# Patient Record
Sex: Female | Born: 1990 | Race: Black or African American | Hispanic: No | Marital: Single | Smoking: Former smoker
Health system: Southern US, Community
[De-identification: ages and names within clinical notes are randomized; demographics above are authoritative.]

## PROBLEM LIST (undated history)

## (undated) DIAGNOSIS — F988 Other specified behavioral and emotional disorders with onset usually occurring in childhood and adolescence: Secondary | ICD-10-CM

## (undated) DIAGNOSIS — T7840XA Allergy, unspecified, initial encounter: Secondary | ICD-10-CM

## (undated) DIAGNOSIS — K219 Gastro-esophageal reflux disease without esophagitis: Secondary | ICD-10-CM

## (undated) HISTORY — DX: Gastro-esophageal reflux disease without esophagitis: K21.9

## (undated) HISTORY — DX: Other specified behavioral and emotional disorders with onset usually occurring in childhood and adolescence: F98.8

## (undated) HISTORY — DX: Allergy, unspecified, initial encounter: T78.40XA

---
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
## 2010-09-04 ENCOUNTER — Encounter
Admission: RE | Admit: 2010-09-04 | Discharge: 2010-09-04 | Payer: Self-pay | Source: Home / Self Care | Attending: Orthopedic Surgery | Admitting: Orthopedic Surgery

## 2010-10-16 ENCOUNTER — Ambulatory Visit (INDEPENDENT_AMBULATORY_CARE_PROVIDER_SITE_OTHER): Payer: PRIVATE HEALTH INSURANCE | Admitting: Family Medicine

## 2010-10-16 DIAGNOSIS — R5381 Other malaise: Secondary | ICD-10-CM

## 2010-10-16 DIAGNOSIS — R5383 Other fatigue: Secondary | ICD-10-CM

## 2010-12-21 LAB — WET PREP, GENITAL: Yeast Wet Prep HPF POC: NONE SEEN

## 2010-12-21 LAB — DIFFERENTIAL
Basophils Absolute: 0.1 10*3/uL (ref 0.0–0.1)
Lymphocytes Relative: 15 % (ref 12–46)
Lymphs Abs: 1.1 10*3/uL (ref 0.7–4.0)
Neutro Abs: 5.3 10*3/uL (ref 1.7–7.7)

## 2010-12-21 LAB — BASIC METABOLIC PANEL
BUN: 7 mg/dL (ref 6–23)
Calcium: 9.1 mg/dL (ref 8.4–10.5)
GFR calc non Af Amer: >60 mL/min (ref >60)
Glucose, Bld: 96 mg/dL (ref 70–99)
Sodium: 137 mEq/L (ref 135–145)

## 2010-12-21 LAB — URINE CULTURE

## 2010-12-21 LAB — URINALYSIS, ROUTINE W REFLEX MICROSCOPIC
Bilirubin Urine: NEGATIVE
Nitrite: POSITIVE — AB
Specific Gravity, Urine: 1.01 (ref 1.005–1.030)
Urobilinogen, UA: 1 mg/dL (ref 0.0–1.0)
pH: 6.5 (ref 5.0–8.0)

## 2010-12-21 LAB — URINE MICROSCOPIC-ADD ON

## 2010-12-21 LAB — CBC
Hemoglobin: 12.4 g/dL (ref 12.0–15.0)
Platelets: 190 10*3/uL (ref 150–400)
RDW: 13.6 % (ref 11.5–15.5)
WBC: 7.5 10*3/uL (ref 4.0–10.5)

## 2010-12-21 LAB — POCT PREGNANCY, URINE: Preg Test, Ur: NEGATIVE

## 2011-01-05 ENCOUNTER — Emergency Department (HOSPITAL_COMMUNITY)
Admission: EM | Admit: 2011-01-05 | Discharge: 2011-01-05 | Disposition: A | Discharge status: Home / Self Care | Payer: PRIVATE HEALTH INSURANCE | Attending: Emergency Medicine | Admitting: Emergency Medicine

## 2011-01-05 DIAGNOSIS — J029 Acute pharyngitis, unspecified: Secondary | ICD-10-CM | POA: Insufficient documentation

## 2011-01-05 DIAGNOSIS — F988 Other specified behavioral and emotional disorders with onset usually occurring in childhood and adolescence: Secondary | ICD-10-CM | POA: Insufficient documentation

## 2011-01-22 ENCOUNTER — Emergency Department (HOSPITAL_COMMUNITY)
Admission: EM | Admit: 2011-01-22 | Discharge: 2011-01-23 | Disposition: A | Discharge status: Home / Self Care | Payer: No Typology Code available for payment source | Attending: Emergency Medicine | Admitting: Emergency Medicine

## 2011-01-22 DIAGNOSIS — M25569 Pain in unspecified knee: Secondary | ICD-10-CM | POA: Insufficient documentation

## 2011-01-22 DIAGNOSIS — F988 Other specified behavioral and emotional disorders with onset usually occurring in childhood and adolescence: Secondary | ICD-10-CM | POA: Insufficient documentation

## 2011-01-22 DIAGNOSIS — IMO0002 Reserved for concepts with insufficient information to code with codable children: Secondary | ICD-10-CM | POA: Insufficient documentation

## 2011-01-22 DIAGNOSIS — R51 Headache: Secondary | ICD-10-CM | POA: Insufficient documentation

## 2011-01-22 DIAGNOSIS — Z79899 Other long term (current) drug therapy: Secondary | ICD-10-CM | POA: Insufficient documentation

## 2011-01-22 DIAGNOSIS — M25579 Pain in unspecified ankle and joints of unspecified foot: Secondary | ICD-10-CM | POA: Insufficient documentation

## 2011-01-22 DIAGNOSIS — M25559 Pain in unspecified hip: Secondary | ICD-10-CM | POA: Insufficient documentation

## 2011-01-23 ENCOUNTER — Emergency Department (HOSPITAL_COMMUNITY): Payer: No Typology Code available for payment source

## 2011-04-09 ENCOUNTER — Other Ambulatory Visit: Payer: Self-pay | Admitting: Orthopedic Surgery

## 2011-04-09 DIAGNOSIS — R531 Weakness: Secondary | ICD-10-CM

## 2011-04-09 DIAGNOSIS — M25571 Pain in right ankle and joints of right foot: Secondary | ICD-10-CM

## 2011-04-11 ENCOUNTER — Ambulatory Visit
Admission: RE | Admit: 2011-04-11 | Discharge: 2011-04-11 | Disposition: A | Discharge status: Home / Self Care | Payer: PRIVATE HEALTH INSURANCE | Source: Ambulatory Visit | Attending: Orthopedic Surgery | Admitting: Orthopedic Surgery

## 2011-04-11 DIAGNOSIS — M25571 Pain in right ankle and joints of right foot: Secondary | ICD-10-CM

## 2011-04-11 DIAGNOSIS — R531 Weakness: Secondary | ICD-10-CM

## 2011-11-25 ENCOUNTER — Ambulatory Visit (INDEPENDENT_AMBULATORY_CARE_PROVIDER_SITE_OTHER): Payer: PRIVATE HEALTH INSURANCE | Admitting: Family Medicine

## 2011-11-25 VITALS — BP 100/65 | HR 49 | Temp 97.7°F | Resp 14 | Ht 63.5 in | Wt 124.0 lb

## 2011-11-25 DIAGNOSIS — F988 Other specified behavioral and emotional disorders with onset usually occurring in childhood and adolescence: Secondary | ICD-10-CM | POA: Insufficient documentation

## 2011-11-25 NOTE — Patient Instructions (Signed)
Attention Deficit Hyperactivity Disorder Attention deficit hyperactivity disorder (ADHD) is a problem with behavior issues based on the way the brain functions (neurobehavioral disorder). It is a common reason for behavior and academic problems in school. CAUSES  The cause of ADHD is unknown in most cases. It may run in families. It sometimes can be associated with learning disabilities and other behavioral problems. SYMPTOMS  There are 3 types of ADHD. The 3 types and some of the symptoms include:  Inattentive   Gets bored or distracted easily.   Loses or forgets things. Forgets to hand in homework.   Has trouble organizing or completing tasks.   Difficulty staying on task.   An inability to organize daily tasks and school work.   Leaving projects, chores, or homework unfinished.   Trouble paying attention or responding to details. Careless mistakes.   Difficulty following directions. Often seems like is not listening.   Dislikes activities that require sustained attention (like chores or homework).   Hyperactive-impulsive   Feels like it is impossible to sit still or stay in a seat. Fidgeting with hands and feet.   Trouble waiting turn.   Talking too much or out of turn. Interruptive.   Speaks or acts impulsively.   Aggressive, disruptive behavior.   Constantly busy or on the go, noisy.   Combined   Has symptoms of both of the above.  Often children with ADHD feel discouraged about themselves and with school. They often perform well below their abilities in school. These symptoms can cause problems in home, school, and in relationships with peers. As children get older, the excess motor activities can calm down, but the problems with paying attention and staying organized persist. Most children do not outgrow ADHD but with good treatment can learn to cope with the symptoms. DIAGNOSIS  When ADHD is suspected, the diagnosis should be made by professionals trained in  ADHD.  Diagnosis will include:  Ruling out other reasons for the child's behavior.   The caregivers will check with the child's school and check their medical records.   They will talk to teachers and parents.   Behavior rating scales for the child will be filled out by those dealing with the child on a daily basis.  A diagnosis is made only after all information has been considered. TREATMENT  Treatment usually includes behavioral treatment often along with medicines. It may include stimulant medicines. The stimulant medicines decrease impulsivity and hyperactivity and increase attention. Other medicines used include antidepressants and certain blood pressure medicines. Most experts agree that treatment for ADHD should address all aspects of the child's functioning. Treatment should not be limited to the use of medicines alone. Treatment should include structured classroom management. The parents must receive education to address rewarding good behavior, discipline, and limit-setting. Tutoring or behavioral therapy or both should be available for the child. If untreated, the disorder can have long-term serious effects into adolescence and adulthood. HOME CARE INSTRUCTIONS   Often with ADHD there is a lot of frustration among the family in dealing with the illness. There is often blame and anger that is not warranted. This is a life long illness. There is no way to prevent ADHD. In many cases, because the problem affects the family as a whole, the entire family may need help. A therapist can help the family find better ways to handle the disruptive behaviors and promote change. If the child is young, most of the therapist's work is with the parents. Parents will   learn techniques for coping with and improving their child's behavior. Sometimes only the child with the ADHD needs counseling. Your caregivers can help you make these decisions.   Children with ADHD may need help in organizing. Some  helpful tips include:   Keep routines the same every day from wake-up time to bedtime. Schedule everything. This includes homework and playtime. This should include outdoor and indoor recreation. Keep the schedule on the refrigerator or a bulletin board where it is frequently seen. Mark schedule changes as far in advance as possible.   Have a place for everything and keep everything in its place. This includes clothing, backpacks, and school supplies.   Encourage writing down assignments and bringing home needed books.   Offer your child a well-balanced diet. Breakfast is especially important for school performance. Children should avoid drinks with caffeine including:   Soft drinks.   Coffee.   Tea.   However, some older children (adolescents) may find these drinks helpful in improving their attention.   Children with ADHD need consistent rules that they can understand and follow. If rules are followed, give small rewards. Children with ADHD often receive, and expect, criticism. Look for good behavior and praise it. Set realistic goals. Give clear instructions. Look for activities that can foster success and self-esteem. Make time for pleasant activities with your child. Give lots of affection.   Parents are their children's greatest advocates. Learn as much as possible about ADHD. This helps you become a stronger and better advocate for your child. It also helps you educate your child's teachers and instructors if they feel inadequate in these areas. Parent support groups are often helpful. A national group with local chapters is called CHADD (Children and Adults with Attention Deficit Hyperactivity Disorder).  PROGNOSIS  There is no cure for ADHD. Children with the disorder seldom outgrow it. Many find adaptive ways to accommodate the ADHD as they mature. SEEK MEDICAL CARE IF:  Your child has repeated muscle twitches, cough or speech outbursts.   Your child has sleep problems.   Your  child has a marked loss of appetite.   Your child develops depression.   Your child has new or worsening behavioral problems.   Your child develops dizziness.   Your child has a racing heart.   Your child has stomach pains.   Your child develops headaches.  Document Released: 08/22/2002 Document Revised: 08/21/2011 Document Reviewed: 04/03/2008 ExitCare Patient Information 2012 [employer]. 

## 2011-11-25 NOTE — Progress Notes (Signed)
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 She was seen by a local therapist named Jae Dire muscle white where it was determined that she does have ADD. She's been seeing Dr. Merla Riches who has been treating her with Adderall 15 mg twice a day since. She is no family history of sudden death, no palpitations, no history of congenital heart disease, no history of hypertension, no chest pain, no shortness of breath, and has been doing well in school on the current medication.  Patient seen with grandmother today. Both are pleasant and appropriate.  Objective: HEENT unremarkable  Chest: Clear  Heart: Regular, bradycardic without murmur.  Assessment: Stable on a Adderall.  Plan continue meds x3 months and recheck

## 2012-02-26 ENCOUNTER — Telehealth: Payer: Self-pay

## 2012-02-26 NOTE — Telephone Encounter (Signed)
NEEDS REFILL ON ADDERALL - DOOLITTLE USUALLY WRITES THREE PRESCRIPTIONS AT A TIME FOR IT

## 2012-02-26 NOTE — Telephone Encounter (Signed)
Chart pulled to PA 

## 2012-02-29 MED ORDER — AMPHETAMINE-DEXTROAMPHETAMINE 15 MG PO TABS: 15.0000 mg | ORAL_TABLET | Freq: Two times a day (BID) | ORAL | Status: DC

## 2012-02-29 NOTE — Telephone Encounter (Signed)
Rx done x 3 months but then she needs recheck.

## 2012-02-29 NOTE — Telephone Encounter (Signed)
Grandmother notified

## 2012-04-16 ENCOUNTER — Ambulatory Visit (INDEPENDENT_AMBULATORY_CARE_PROVIDER_SITE_OTHER): Payer: PRIVATE HEALTH INSURANCE | Admitting: Physician Assistant

## 2012-04-16 VITALS — BP 88/60 | HR 62 | Temp 97.7°F | Resp 16 | Ht 63.75 in | Wt 128.8 lb

## 2012-04-16 DIAGNOSIS — L709 Acne, unspecified: Secondary | ICD-10-CM

## 2012-04-16 DIAGNOSIS — R41840 Attention and concentration deficit: Secondary | ICD-10-CM

## 2012-04-16 DIAGNOSIS — L708 Other acne: Secondary | ICD-10-CM

## 2012-04-16 DIAGNOSIS — M25569 Pain in unspecified knee: Secondary | ICD-10-CM

## 2012-04-16 MED ORDER — MELOXICAM 15 MG PO TABS: 15.0000 mg | ORAL_TABLET | Freq: Every day | ORAL | Status: AC

## 2012-04-16 MED ORDER — TRETINOIN 0.05 % EX CREA: TOPICAL_CREAM | Freq: Every day | CUTANEOUS | Status: AC

## 2012-04-16 NOTE — Progress Notes (Signed)
  Subjective:    Patient ID: Maureen Cantrell, female    DOB: 1991/03/05, 21 y.o.   MRN: [id number]  HPI Patient presents for recheck of ADHD.  She currently on Adderall 15 mg bid and is stable on her current dose. Has no problems with the medication and is satisfied with this treatment.  She also is concerned about acne on her back that has been there for "years." she has not tried anything OTC or any prescription medications for this. She does not have any facial acne.  In addition she complains of left knee pain x 6 months. Says this is a dull, achy pain that occurs daily and is worse when she is active. She plays basketball at [employer] and therefore is active year round without any time that she rests her knee.  Her trainer has given her ibuprofen which helps.  No specific injury - gradual onset of pain.      Review of Systems  All other systems reviewed and are negative.       Objective:   Physical Exam  Constitutional: She is oriented to person, place, and time. She appears well-developed and well-nourished.  HENT:  Head: Normocephalic and atraumatic.  Right Ear: External ear normal.  Left Ear: External ear normal.  Eyes: Conjunctivae are normal.  Neck: Normal range of motion.  Cardiovascular: Normal rate, regular rhythm and normal heart sounds.   Pulmonary/Chest: Effort normal and breath sounds normal.  Abdominal: Soft. Bowel sounds are normal.  Musculoskeletal:       Left knee: She exhibits normal range of motion, no swelling, no effusion, no ecchymosis, no deformity, no laceration, no erythema, normal alignment, no LCL laxity, normal patellar mobility, no bony tenderness, normal meniscus and no MCL laxity. tenderness found. Patellar tendon tenderness noted. No medial joint line, no lateral joint line, no MCL and no LCL tenderness noted.  Neurological: She is alert and oriented to person, place, and time.  Skin:     Psychiatric: She has a normal mood and affect. Her behavior is  normal. Judgment and thought content normal.          Assessment & Plan:   1. Knee pain  Try Mobic 15 mg daily. Likely tendinitis - treat with anti-inflammatories, ice, and rest (as much as possible)  2. Attention or concentration deficit  Call for refill in September. Last refill 02/29/12 x 3 months. Ok for 3 months at a time with follow up in 6 months.   3. Acne  Recommend OTC Benzoyl peroxide wash with daily application of Retin-A

## 2012-04-16 NOTE — Patient Instructions (Addendum)
Acne Free benzoyl peroxide wash (over the counter) Can use Cetaphil moisturizer if skin is dry.

## 2012-05-25 ENCOUNTER — Telehealth: Payer: Self-pay

## 2012-05-25 MED ORDER — AMPHETAMINE-DEXTROAMPHETAMINE 15 MG PO TABS: 15.0000 mg | ORAL_TABLET | Freq: Every day | ORAL | Status: DC

## 2012-05-25 MED ORDER — AMPHETAMINE-DEXTROAMPHETAMINE 15 MG PO TABS: 15.0000 mg | ORAL_TABLET | Freq: Two times a day (BID) | ORAL | Status: DC

## 2012-05-25 NOTE — Telephone Encounter (Signed)
I will write for 3 months at a time, but if any are lost they will not be replaced, no exceptions. Rx's written.

## 2012-05-25 NOTE — Telephone Encounter (Signed)
To PAs to rx-- ok x 3 months at a time per last OV

## 2012-05-25 NOTE — Telephone Encounter (Signed)
Mother is requesting three scripts for adderall for daughter.  Call [phone]

## 2012-05-26 NOTE — Telephone Encounter (Signed)
Notified that Rxs are ready for p/up and that if lost they can not be replaced.

## 2012-08-17 ENCOUNTER — Telehealth: Payer: Self-pay | Admitting: Physician Assistant

## 2012-08-17 MED ORDER — AMPHETAMINE-DEXTROAMPHETAMINE 15 MG PO TABS: 15.0000 mg | ORAL_TABLET | Freq: Two times a day (BID) | ORAL | Status: DC

## 2012-08-17 NOTE — Telephone Encounter (Signed)
Patient has brought in rx written on 05/25/12 that was to be filled on/after 11/16 - was written as 1 tablet daily but given #60. This is incorrect, she takes bid. Will write new rx today.

## 2012-09-15 ENCOUNTER — Telehealth: Payer: Self-pay

## 2012-09-15 NOTE — Telephone Encounter (Signed)
Patient due for follow up in Feb/ please advise on refill.

## 2012-09-15 NOTE — Telephone Encounter (Signed)
Adderall 3 months supply [phone]

## 2012-09-17 MED ORDER — AMPHETAMINE-DEXTROAMPHETAMINE 15 MG PO TABS: 15.0000 mg | ORAL_TABLET | Freq: Two times a day (BID) | ORAL | Status: DC

## 2012-09-17 NOTE — Telephone Encounter (Signed)
Called patient to advise her grandmother will p/u

## 2012-09-17 NOTE — Telephone Encounter (Signed)
1 month refill given - needs follow up before out

## 2012-10-04 ENCOUNTER — Ambulatory Visit (INDEPENDENT_AMBULATORY_CARE_PROVIDER_SITE_OTHER): Payer: PRIVATE HEALTH INSURANCE | Admitting: Family Medicine

## 2012-10-04 VITALS — BP 95/57 | HR 71 | Temp 98.9°F | Resp 17 | Ht 64.5 in | Wt 125.0 lb

## 2012-10-04 DIAGNOSIS — F988 Other specified behavioral and emotional disorders with onset usually occurring in childhood and adolescence: Secondary | ICD-10-CM

## 2012-10-04 MED ORDER — AMPHETAMINE-DEXTROAMPHETAMINE 15 MG PO TABS: 15.0000 mg | ORAL_TABLET | Freq: Two times a day (BID) | ORAL | Status: DC

## 2012-10-04 NOTE — Progress Notes (Signed)
[facility] [address], [place] [address] [phone]  Date:  10/04/2012   Name:  Maureen Cantrell   DOB:  02-03-1991   MRN:  [id number]  PCP:  Tonye Pearson, MD    Chief Complaint: Medication Refill   History of Present Illness:  Maureen Cantrell is a 22 y.o. very pleasant female patient who presents with the following:  History of ADD treated with adderall. Last seen here in August.  She was given her most recent RF of adderall #60 tablets on 09/17/12 and asked to RTC for a follow- up prior to expiration of her rx.    She is doing well at [employer].  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   She is doing well with her current dose of medication.  She notes no problems with heart palpitation, trouble sleeping or anything else.    Her LMP is today. 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 ADD (attention deficit disorder)    History reviewed. No pertinent past medical history.  History reviewed. No pertinent past surgical history.  History  Substance Use Topics  . Smoking status: Never Smoker   . Smokeless tobacco: Not on file  . Alcohol Use: No    History reviewed. No pertinent family history.  No Known Allergies  Medication list has been reviewed and updated.  Current Outpatient Prescriptions on File Prior to Visit  Medication Sig Dispense Refill  . amphetamine-dextroamphetamine (ADDERALL) 15 MG tablet Take 1 tablet (15 mg total) by mouth daily. May fill on/after 07/31/2012.  60 tablet  0  . amphetamine-dextroamphetamine (ADDERALL) 15 MG tablet Take 1 tablet (15 mg total) by mouth 2 (two) times daily.  60 tablet  0  . amphetamine-dextroamphetamine (ADDERALL) 15 MG tablet Take 1 tablet (15 mg total) by mouth 2 (two) times daily. Due for follow up in Feb. 2014  60 tablet  0  . meloxicam  (MOBIC) 15 MG tablet Take 1 tablet (15 mg total) by mouth daily.  30 tablet  1  . tretinoin (RETIN-A) 0.05 % cream Apply topically at bedtime.  45 g  0    Review of Systems:  As per HPI- otherwise negative.   Physical Examination: Filed Vitals:   10/04/12 0804  BP: 95/57  Pulse: 71  Temp: 98.9 F (37.2 C)  Resp: 17   Filed Vitals:   10/04/12 0804  Height: 5' 4.5" (1.638 m)  Weight: 125 lb (56.7 kg)   Body mass index is 21.12 kg/(m^2). Ideal Body Weight: Weight in (lb) to have BMI = 25: 147.6   GEN: WDWN, NAD, Non-toxic, A & O x 3, healthy and well- appearing HEENT: Atraumatic, Normocephalic. Neck supple. No masses, No LAD. Ears and Nose: No external deformity. CV: RRR, No M/G/R. No JVD. No thrill. No extra heart sounds. PULM: CTA B, no wheezes, crackles, rhonchi. No retractions. No resp. distress. No accessory muscle use. ABD: S, NT, ND EXTR: No c/c/e NEURO Normal gait.  PSYCH: Normally interactive. Conversant. Not depressed or anxious appearing.  Calm demeanor.    Assessment and Plan: 1. ADD (attention deficit disorder)  amphetamine-dextroamphetamine (ADDERALL) 15 MG tablet, amphetamine-dextroamphetamine (ADDERALL) 15 MG tablet, DISCONTINUED: amphetamine-dextroamphetamine (ADDERALL) 15 MG tablet   Gave 3 months of adderall today to fill Feb, March and April. In April  she may have 3 months more of medication, then recheck in 6 months time,   Abbe Amsterdam, MD

## 2012-10-04 NOTE — Patient Instructions (Addendum)
Good luck with the rest of your year! Please give [place] a call towards the end of April and we can give you 3 more months of Adderall- then will need a clinic recheck in 6 months.

## 2013-01-27 ENCOUNTER — Ambulatory Visit (INDEPENDENT_AMBULATORY_CARE_PROVIDER_SITE_OTHER): Payer: PRIVATE HEALTH INSURANCE | Admitting: Family Medicine

## 2013-01-27 VITALS — BP 118/74 | HR 66 | Temp 98.0°F | Resp 17 | Ht 64.0 in | Wt 124.0 lb

## 2013-01-27 DIAGNOSIS — F988 Other specified behavioral and emotional disorders with onset usually occurring in childhood and adolescence: Secondary | ICD-10-CM

## 2013-01-27 MED ORDER — AMPHETAMINE-DEXTROAMPHETAMINE 15 MG PO TABS: 15.0000 mg | ORAL_TABLET | Freq: Two times a day (BID) | ORAL | Status: DC

## 2013-01-27 NOTE — Progress Notes (Signed)
[facility] [address] [place] [address] [phone]  Date:  01/27/2013   Name:  Maureen Cantrell   DOB:  09/20/1990   MRN:  [id number]  PCP:  Tonye Pearson, MD    Chief Complaint: Medication Refill   History of Present Illness:  Maureen Cantrell is a 22 y.o. very pleasant female patient who presents with the following:  She just graduated from [employer].  She would like to enter the [employer] in the future, and would like to try and taper off of her her adderall in the next few months. She cannot be on adderall and join the [employer].    She is working with childrne with special needs right now.   LMP 01/12/13  She is doing well with her current adderall dose and wants to continue for now.   Patient Active Problem List   Diagnosis Date Noted  . ADD (attention deficit disorder) 11/25/2011    Past Medical History  Diagnosis Date  . Allergy     History reviewed. No pertinent past surgical history.  History  Substance Use Topics  . Smoking status: Never Smoker   . Smokeless tobacco: Not on file  . Alcohol Use: No    History reviewed. No pertinent family history.  No Known Allergies  Medication list has been reviewed and updated.  Current Outpatient Prescriptions on File Prior to Visit  Medication Sig Dispense Refill  . amphetamine-dextroamphetamine (ADDERALL) 15 MG tablet Take 1 tablet (15 mg total) by mouth daily. May fill on/after 07/31/2012.  60 tablet  0  . amphetamine-dextroamphetamine (ADDERALL) 15 MG tablet Take 1 tablet (15 mg total) by mouth 2 (two) times daily.  60 tablet  0  . amphetamine-dextroamphetamine (ADDERALL) 15 MG tablet Take 1 tablet (15 mg total) by mouth 2 (two) times daily. To fill 11/13/12  60 tablet  0  . amphetamine-dextroamphetamine (ADDERALL) 15 MG tablet Take 1 tablet (15 mg total) by mouth 2 (two) times daily. To fill 11/13/12  60 tablet  0  . amphetamine-dextroamphetamine (ADDERALL) 15 MG tablet Take 1  tablet (15 mg total) by mouth 2 (two) times daily. To fill 12/14/12  60 tablet  0  . meloxicam (MOBIC) 15 MG tablet Take 1 tablet (15 mg total) by mouth daily.  30 tablet  1  . tretinoin (RETIN-A) 0.05 % cream Apply topically at bedtime.  45 g  0   No current facility-administered medications on file prior to visit.    Review of Systems:  As per HPI- otherwise negative.   Physical Examination: Filed Vitals:   01/27/13 1157  BP: 118/74  Pulse: 66  Temp: 98 F (36.7 C)  Resp: 17   Filed Vitals:   01/27/13 1157  Height: 5\' 4"  (1.626 m)  Weight: 124 lb (56.246 kg)   Body mass index is 21.27 kg/(m^2). Ideal Body Weight: Weight in (lb) to have BMI = 25: 145.3  GEN: WDWN, NAD, Non-toxic, A & O x 3 HEENT: Atraumatic, Normocephalic. Neck supple. No masses, No LAD. Ears and Nose: No external deformity. CV: RRR, No M/G/R. No JVD. No thrill. No extra heart sounds. PULM: CTA B, no wheezes, crackles, rhonchi. No retractions. No resp. distress. No accessory muscle use. EXTR: No c/c/e NEURO Normal gait.  PSYCH: Normally interactive. Conversant. Not depressed or anxious appearing.  Calm demeanor.    Assessment and Plan: ADD (attention deficit disorder) - Plan: amphetamine-dextroamphetamine (ADDERALL) 15 MG tablet, amphetamine-dextroamphetamine (ADDERALL) 15 MG  tablet, amphetamine-dextroamphetamine (ADDERALL) 15 MG tablet  Refilled adderall for another 3 months.  Follow-up as needed.  Went over how to taper her adderall over the course of 2 or 3 weeks when she is ready   Signed Abbe Amsterdam, MD

## 2013-01-27 NOTE — Patient Instructions (Addendum)
Congratulation on your graduation.  Let us know if you need any help tapering off of the adderall

## 2013-04-15 DIAGNOSIS — Z0271 Encounter for disability determination: Secondary | ICD-10-CM

## 2013-06-11 ENCOUNTER — Ambulatory Visit: Payer: PRIVATE HEALTH INSURANCE | Admitting: Internal Medicine

## 2013-06-11 VITALS — BP 92/60 | HR 66 | Temp 98.8°F | Resp 18 | Ht 63.5 in | Wt 122.6 lb

## 2013-06-11 DIAGNOSIS — Z029 Encounter for administrative examinations, unspecified: Secondary | ICD-10-CM

## 2013-06-11 DIAGNOSIS — N644 Mastodynia: Secondary | ICD-10-CM

## 2013-06-11 DIAGNOSIS — Z0289 Encounter for other administrative examinations: Secondary | ICD-10-CM

## 2013-06-13 NOTE — Progress Notes (Signed)
  Subjective:    Patient ID: Maureen Cantrell, female    DOB: [birth year], 22 y.o.   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  Never expos to TB w/ prior neg test History of ADD now doing well off medication  Review of Systems No weight loss or night sweats No fever No cough or shortness of breath    Objective:   Physical Exam BP 92/60  Pulse 66  Temp(Src) 98.8 F (37.1 C) (Oral)  Resp 18  Ht 5' 3.5" (1.613 m)  Wt 122 lb 9.6 oz (55.611 kg)  BMI 21.37 kg/m2  SpO2 100%  LMP 05/02/2013 nad HEENT-clear Lungs -cl Ht-reg no M R breast larger than L No nipple d/c  Tender upper inner quadr with firmness that is regular/no dimpling No axillary nodes extr clear Mood good/aff appr      Assessment & Plan:  Unilateral breast tenderness with swelling-suspect premenstrual fibro- adenomatous changes  Will reexamine post menses  If they become concerned we can schedule an ultrasound/grandmother will call  Okay for Student teaching  Discussed careers that suit attention deficit disorder/no need for medications

## 2013-11-02 ENCOUNTER — Ambulatory Visit (INDEPENDENT_AMBULATORY_CARE_PROVIDER_SITE_OTHER): Payer: PRIVATE HEALTH INSURANCE | Admitting: Family Medicine

## 2013-11-02 VITALS — BP 110/60 | HR 62 | Temp 98.0°F | Resp 16 | Ht 62.0 in | Wt 120.0 lb

## 2013-11-02 DIAGNOSIS — F988 Other specified behavioral and emotional disorders with onset usually occurring in childhood and adolescence: Secondary | ICD-10-CM

## 2013-11-02 MED ORDER — AMPHETAMINE-DEXTROAMPHETAMINE 15 MG PO TABS: 15.0000 mg | ORAL_TABLET | Freq: Two times a day (BID) | ORAL | Status: DC

## 2013-11-02 NOTE — Progress Notes (Signed)
[facility] [address], [place] [id number] [phone]  Date:  11/02/2013   Name:  Maureen Cantrell   DOB:  03/05/1991   MRN:  [id number]  PCP:  Tonye PearsonOLITTLE, ROBERT P, MD    Chief Complaint: Medication Refill   History of Present Illness:  Maureen Cantrell is a 23 y.o. very pleasant female patient who presents with the following:  History of ADD- otherwise generally healthy She would like to restart her adderall- she has not taken it in a couple of months.  She wanted to see how she did without the medication, but has had trouble with focusing. 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   She did notice decreased appeitite while on the medication, but slept well She is concerned that she might need to gain weight, but has a "fast metabolism" and has trouble with this.    Wt Readings from Last 3 Encounters:  11/02/13 120 lb (54.432 kg)  06/11/13 122 lb 9.6 oz (55.611 kg)  01/27/13 124 lb (56.246 kg)   She had been taking adderall 15 BID.  Felt that this dose was good for her in the past LMP was last week.    Patient Active Problem List   Diagnosis Date Noted  . ADD (attention deficit disorder) 11/25/2011    Past Medical History  Diagnosis Date  . Allergy     History reviewed. No pertinent past surgical history.  History  Substance Use Topics  . Smoking status: Never Smoker   . Smokeless tobacco: Not on file  . Alcohol Use: No    History reviewed. No pertinent family history.  No Known Allergies  Medication list has been reviewed and updated.  Current Outpatient Prescriptions on File Prior to Visit  Medication Sig Dispense Refill  . amphetamine-dextroamphetamine (ADDERALL) 15 MG tablet Take 1 tablet (15 mg total) by mouth 2 (two) times daily. To fill 12/14/12  60 tablet  0  . amphetamine-dextroamphetamine (ADDERALL) 15 MG tablet Take 1 tablet (15 mg total) by mouth 2 (two) times daily.  60 tablet  0  .  amphetamine-dextroamphetamine (ADDERALL) 15 MG tablet Take 1 tablet (15 mg total) by mouth 2 (two) times daily. 02/25/2013  60 tablet  0  . amphetamine-dextroamphetamine (ADDERALL) 15 MG tablet Take 1 tablet (15 mg total) by mouth 2 (two) times daily. To fill 03/26/2013  60 tablet  0   No current facility-administered medications on file prior to visit.    Review of Systems:  As per HPI- otherwise negative.   Physical Examination: Filed Vitals:   11/02/13 1154  BP: 110/60  Pulse: 62  Temp: 98 F (36.7 C)  Resp: 16   Filed Vitals:   11/02/13 1154  Height: 5\' 2"  (1.575 m)  Weight: 120 lb (54.432 kg)   Body mass index is 21.94 kg/(m^2). Ideal Body Weight: Weight in (lb) to have BMI = 25: 136.4  GEN: WDWN, NAD, Non-toxic, A & O x 3 HEENT: Atraumatic, Normocephalic. Neck supple. No masses, No LAD. Ears and Nose: No external deformity. CV: RRR, No M/G/R. No JVD. No thrill. No extra heart sounds. PULM: CTA B, no wheezes, crackles, rhonchi. No retractions. No resp. distress. No accessory muscle use. ABD: S, NT, ND, +BS. No rebound. No HSM. EXTR: No c/c/e NEURO Normal gait.  PSYCH: Normally interactive. Conversant. Not depressed or anxious appearing.  Calm demeanor.    Assessment and Plan: ADD (attention deficit disorder) - Plan: amphetamine-dextroamphetamine (  ADDERALL) 15 MG tablet, amphetamine-dextroamphetamine (ADDERALL) 15 MG tablet, amphetamine-dextroamphetamine (ADDERALL) 15 MG tablet  Restart adderall gradually- See patient instructions for more details.   Reassured that her BMI is actually ideal and she is at a good weight for her health.  Encouraged nutritious snacks and regular meals.    May refill adderall for 3 months in May, recheck here in 6 months   Signed Abbe Amsterdam, MD

## 2013-11-02 NOTE — Patient Instructions (Signed)
Start back on your adderall- take a 1/2 tablet once or twice a day, then build back up to a whole tablet twice a day.  Let me know if you have any problems.  If you are doing well I can give you 3 months more in May.

## 2014-01-27 ENCOUNTER — Telehealth: Payer: Self-pay

## 2014-01-27 DIAGNOSIS — F988 Other specified behavioral and emotional disorders with onset usually occurring in childhood and adolescence: Secondary | ICD-10-CM

## 2014-01-27 MED ORDER — AMPHETAMINE-DEXTROAMPHETAMINE 15 MG PO TABS: 15.0000 mg | ORAL_TABLET | Freq: Two times a day (BID) | ORAL | Status: DC

## 2014-01-27 NOTE — Telephone Encounter (Signed)
Did refills for may, June and July today. Called and left message with female who answered her phone

## 2014-01-27 NOTE — Telephone Encounter (Signed)
Pt request adderall refill.  Pt [phone]

## 2014-03-01 ENCOUNTER — Telehealth: Payer: Self-pay

## 2014-03-01 NOTE — Telephone Encounter (Signed)
The only med I see is Adderall. We gave her 3 months on 01/27/14. Left message on machine to call back

## 2014-03-01 NOTE — Telephone Encounter (Signed)
Pt called and said her prescriptions were sent to the wrong pharmacy, and would like them resent to [facility]

## 2014-03-02 NOTE — Telephone Encounter (Signed)
Pt states she has located the prescriptions and has gotten them filled. No refills needed.

## 2014-05-04 ENCOUNTER — Ambulatory Visit (INDEPENDENT_AMBULATORY_CARE_PROVIDER_SITE_OTHER): Payer: No Typology Code available for payment source | Admitting: Physician Assistant

## 2014-05-04 VITALS — BP 90/60 | HR 67 | Temp 97.8°F | Resp 16 | Ht 63.0 in | Wt 122.0 lb

## 2014-05-04 DIAGNOSIS — Z23 Encounter for immunization: Secondary | ICD-10-CM

## 2014-05-04 DIAGNOSIS — F988 Other specified behavioral and emotional disorders with onset usually occurring in childhood and adolescence: Secondary | ICD-10-CM

## 2014-05-04 MED ORDER — AMPHETAMINE-DEXTROAMPHETAMINE 15 MG PO TABS: 15.0000 mg | ORAL_TABLET | Freq: Two times a day (BID) | ORAL | Status: DC

## 2014-05-04 NOTE — Patient Instructions (Addendum)
When you come next time, have nothing to eat or drink (water, black coffee or unsweetened tea are OK) for 8-12 hours before.  That way we can get accurate results for diabetes and cholesterol screening.  [facility] Policy for Prescribing Controlled Substances (Revised 07/2012) 1. Prescriptions for controlled substances will be filled by ONE provider at [facility] with whom you have established and developed a plan for your care, including follow-up. 2. You are encouraged to schedule an appointment with your prescriber at our appointment center for follow-up visits whenever possible. 3. If you request a prescription for the controlled substance while at [facility] for an acute problem (with someone other than your regular prescriber), you MAY be given a ONE-TIME prescription for a 30-day supply of the controlled substance, to allow time for you to return to see your regular prescriber for additional prescriptions.

## 2014-05-04 NOTE — Progress Notes (Signed)
   Subjective:    Patient ID: Maureen Cantrell, female    DOB: 01/09/1991, 23 y.o.   MRN: [id number]   PCP: Tonye PearsonOLITTLE, ROBERT P, MD  Chief Complaint  Patient presents with  . Medication Refill    Adderall  . Glucose Check    States her Grandma wanted her to get it checked    Medications, allergies, past medical history, surgical history, family history, social history and problem list reviewed and updated.  HPI  Presents for medication refills.  She's doing well on the current dose of Adderall.  She's sleeping well, has a stable appetite and no adverse effects.  Her grandmother would like her to receive a flu vaccine and get screened for pre-diabetes and hyperlipidemia while she is here.  She is not fasting today (ate about 2.5 hours ago), and doesn't want her blood drawn.   Review of Systems     Objective:   Physical Exam  Constitutional: She is oriented to person, place, and time. She appears well-developed and well-nourished. She is active and cooperative. No distress.  BP 90/60  Pulse 67  Temp(Src) 97.8 F (36.6 C) (Oral)  Resp 16  Ht 5\' 3"  (1.6 m)  Wt 122 lb (55.339 kg)  BMI 21.62 kg/m2  SpO2 100%  LMP 04/04/2014   Cardiovascular: Normal rate.   Pulmonary/Chest: Effort normal.  Neurological: She is alert and oriented to person, place, and time.  Skin: Skin is warm and dry.  Psychiatric: She has a normal mood and affect. Her speech is normal and behavior is normal.          Assessment & Plan:  1. ADD (attention deficit disorder) Reviewed [facility] Controlled Substance Policy.  She may call in 3 months for another 3 prescriptions, and then needs to follow-up with Dr. Merla Richesoolittle. - amphetamine-dextroamphetamine (ADDERALL) 15 MG tablet; Take 1 tablet (15 mg total) by mouth 2 (two) times daily. May fill 30 days after date on prescription  Dispense: 60 tablet; Refill: 0 - amphetamine-dextroamphetamine (ADDERALL) 15 MG tablet; Take 1 tablet (15 mg total) by mouth 2  (two) times daily. May fill 60 days after date on prescription  Dispense: 60 tablet; Refill: 0 - amphetamine-dextroamphetamine (ADDERALL) 15 MG tablet; Take 1 tablet (15 mg total) by mouth 2 (two) times daily.  Dispense: 60 tablet; Refill: 0  2. Need for influenza vaccination - Flu Vaccine QUAD 36+ mos IM  Will plan on fasting glucose and lipids at her next visit.  Fernande Brashelle S. Jeevan Kalla, PA-C Physician Assistant-Certified [facility]

## 2014-07-30 ENCOUNTER — Telehealth: Payer: Self-pay

## 2014-07-30 DIAGNOSIS — F988 Other specified behavioral and emotional disorders with onset usually occurring in childhood and adolescence: Secondary | ICD-10-CM

## 2014-07-30 NOTE — Telephone Encounter (Signed)
Patient is calling to request 3 adderall refills. Please call patient when ready for pickup.

## 2014-07-31 MED ORDER — AMPHETAMINE-DEXTROAMPHETAMINE 15 MG PO TABS: 15.0000 mg | ORAL_TABLET | Freq: Two times a day (BID) | ORAL | Status: DC

## 2014-07-31 NOTE — Telephone Encounter (Signed)
Rxs printed. Please remind her that when she's due for the next 3, she needs a visit with Dr. Merla Richesoolittle, and should come in fasting for labs.

## 2014-08-01 NOTE — Telephone Encounter (Signed)
LM for pt that Rxs are ready and f/up info.

## 2014-10-17 ENCOUNTER — Telehealth: Payer: Self-pay

## 2014-10-17 NOTE — Telephone Encounter (Signed)
PA started for Adderall, awaiting clinical questions.

## 2014-10-20 NOTE — Telephone Encounter (Signed)
Patient calling to follow up on her PA for Adderall.  [phone] (H)

## 2014-10-20 NOTE — Telephone Encounter (Signed)
Approved 09/29/2014 thru 10/20/2015. Pt notified and faxed to pharmacy.

## 2015-06-14 ENCOUNTER — Ambulatory Visit (INDEPENDENT_AMBULATORY_CARE_PROVIDER_SITE_OTHER): Payer: BC Managed Care – PPO

## 2015-06-14 ENCOUNTER — Ambulatory Visit (INDEPENDENT_AMBULATORY_CARE_PROVIDER_SITE_OTHER): Payer: BC Managed Care – PPO | Admitting: Urgent Care

## 2015-06-14 VITALS — BP 98/76 | HR 76 | Temp 98.3°F | Resp 16 | Ht 64.5 in | Wt 123.4 lb

## 2015-06-14 DIAGNOSIS — K219 Gastro-esophageal reflux disease without esophagitis: Secondary | ICD-10-CM

## 2015-06-14 DIAGNOSIS — R0789 Other chest pain: Secondary | ICD-10-CM | POA: Insufficient documentation

## 2015-06-14 MED ORDER — ESOMEPRAZOLE MAGNESIUM 40 MG PO CPDR: 40.0000 mg | DELAYED_RELEASE_CAPSULE | Freq: Every day | ORAL | Status: DC

## 2015-06-14 MED ORDER — FAMOTIDINE 20 MG PO TABS: 20.0000 mg | ORAL_TABLET | Freq: Two times a day (BID) | ORAL | Status: DC

## 2015-06-14 NOTE — Patient Instructions (Signed)

## 2015-06-14 NOTE — Progress Notes (Signed)
    MRN: [id number] DOB: 09-12-1991  Subjective:   Maureen Cantrell is a 24 y.o. female presenting for chief complaint of Palpitations; heart hurts after eating; and hurts sometimes to take a deep breath  Reports 1 month history of mid-right sided chest pain. Pain is sharp in nature, intermittent, lasts about 5-10 minutes followed by achy pain lesser in severity and is sometimes associated with food such as spicy food. Also admits slight difficulty taking a deep breath with onset of her chest pain and has palpitations and heart racing intermittently not associated with her chest pain. Patient eats unhealthy foods, fried foods. Has not tried any medications for relief. Denies fever, shortness of breath, wheezing, chest tightness, cough, sore throat, nausea, vomiting, abdominal pain. Denies family history of cancer, heart disease, irregular heart rhythms in her immediate family. Denies smoking. Denies any other aggravating or relieving factors, no other questions or concerns.  Kameela has a current medication list which includes the following prescription(s): amphetamine-dextroamphetamine, amphetamine-dextroamphetamine, and amphetamine-dextroamphetamine. Also has No Known Allergies.  Luma  has a past medical history of Allergy and ADD (attention deficit disorder). Also  has no past surgical history on file.  Objective:   Vitals: BP 98/76 mmHg  Pulse 76  Temp(Src) 98.3 F (36.8 C) (Oral)  Resp 16  Ht 5' 4.5" (1.638 m)  Wt 123 lb 6.4 oz (55.974 kg)  BMI 20.86 kg/m2  SpO2 98%  LMP 06/06/2015  Physical Exam  Constitutional: She is oriented to person, place, and time. She appears well-developed and well-nourished.  HENT:  Mouth/Throat: Oropharynx is clear and moist.  Eyes: Conjunctivae are normal. Pupils are equal, round, and reactive to light. No scleral icterus.  Neck: Normal range of motion. Neck supple.  Cardiovascular: Normal rate, regular rhythm and intact distal pulses.  Exam  reveals no gallop and no friction rub.   No murmur heard. Pulmonary/Chest: No respiratory distress. She has no wheezes. She has no rales. She exhibits no tenderness.  Abdominal: Soft. Bowel sounds are normal. She exhibits no distension and no mass. There is no tenderness.  Lymphadenopathy:    She has no cervical adenopathy.  Neurological: She is alert and oriented to person, place, and time.  Skin: Skin is warm and dry. No rash noted. No erythema. No pallor.   [facility] reading (PRIMARY) by  Dr. Katrinka Blazing and PA-Mani. Chest - normal.  ECG interpretation by Dr. Katrinka Blazing and PA-Mani - normal sinus rhythm.  Assessment and Plan :   1. Atypical chest pain 2. Gastroesophageal reflux disease, esophagitis presence not specified - Physical exam findings, x-ray and EKG are very reassuring. Due to association with food agents atypical chest pain is likely due to reflux. Patient was agreeable to starting Nexium and Pepcid short-term, recommended dietary modifications. Follow up in 4 weeks or sooner for worsening symptoms. Consider referral to cardiology.  Wallis Bamberg, PA-C [facility] [phone] 06/14/2015 7:32 PM

## 2015-08-13 ENCOUNTER — Encounter: Payer: Self-pay | Admitting: Internal Medicine

## 2015-09-08 ENCOUNTER — Ambulatory Visit (INDEPENDENT_AMBULATORY_CARE_PROVIDER_SITE_OTHER): Payer: BC Managed Care – PPO | Admitting: Internal Medicine

## 2015-09-08 VITALS — BP 100/58 | HR 79 | Temp 97.6°F | Resp 14 | Ht 64.5 in | Wt 123.4 lb

## 2015-09-08 DIAGNOSIS — F988 Other specified behavioral and emotional disorders with onset usually occurring in childhood and adolescence: Secondary | ICD-10-CM

## 2015-09-08 DIAGNOSIS — K222 Esophageal obstruction: Secondary | ICD-10-CM

## 2015-09-08 DIAGNOSIS — F909 Attention-deficit hyperactivity disorder, unspecified type: Secondary | ICD-10-CM

## 2015-09-08 MED ORDER — AMPHETAMINE-DEXTROAMPHETAMINE 15 MG PO TABS: 15.0000 mg | ORAL_TABLET | Freq: Two times a day (BID) | ORAL | Status: DC

## 2015-09-08 MED ORDER — PANTOPRAZOLE SODIUM 40 MG PO TBEC: 40.0000 mg | DELAYED_RELEASE_TABLET | Freq: Every day | ORAL | Status: DC

## 2015-09-08 NOTE — Progress Notes (Signed)
Subjective:  By signing my name below, I, Maureen Cantrell, attest that this documentation has been prepared under the direction and in the presence of Maureen Siaobert Hang Ammon, MD.  Watt Climesawaa Al Cantrell, Medical Scribe. 09/08/2015.  9:01 AM.  I have completed the patient encounter in its entirety as documented by the scribe, with editing by me where necessary. Maureen Cantrell, M.D.    Patient ID: Maureen Cantrell, female    DOB: 06/10/1991, 24 y.o.   MRN: [id number]  Chief Complaint  Patient presents with  . Follow-up    Prediabetes, cholesterol, adhd  . Labs Only    HPI HPI Comments: Maureen Cantrell [place] is a 24 y.o. female who presents to [facility] for [facility] follow up.  Pt notes that she has been healthy.   ADHD: Pt has a hx of ADHD. She is requesting 3 prescription of Adderall. She indicates that she had not needed to take the medication in about 2 years. SEE past hx  Acid Reflux: Pt notes that she has been experiencing acid reflux. She indicates that her symptoms are usually present after she eats food, and reports that she experiences pain with walking when the episode is present. Pt states that she feels that there is something stuck in her esophageal and that she needs to belch to be able to relief that sensation. She notes that the pain is not necessarily present or exacerbated at night time. She is requesting to change her Nexium to a different medication due to its long term side effects.  Blood tests: Pt has been tested for cholesterol and diabetes few years ago-all wnl. 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 Atypical chest pain 06/14/2015  . ADD (attention deficit disorder) 11/25/2011   Past Medical History  Diagnosis Date  . Allergy   . ADD (attention deficit disorder)    History reviewed. No pertinent past  surgical history. No Known Allergies Prior to Admission medications   Medication Sig Start Date End Date Taking? Authorizing Provider  amphetamine-dextroamphetamine (ADDERALL) 15 MG tablet Take 1 tablet by mouth 2 (two) times daily. 07/31/14  Yes Chelle Jeffery, PA-C  famotidine (PEPCID) 20 MG tablet Take 1 tablet (20 mg total) by mouth 2 (two) times daily. 06/14/15  Yes Wallis BambergMario Mani, PA-C  amphetamine-dextroamphetamine (ADDERALL) 15 MG tablet Take 1 tablet by mouth 2 (two) times daily. May fill 30 days after date on prescription Patient not taking: Reported on 09/08/2015 07/31/14   Chelle Jeffery, PA-C  amphetamine-dextroamphetamine (ADDERALL) 15 MG tablet Take 1 tablet by mouth 2 (two) times daily. May fill 60 days after date on prescription Patient not taking: Reported on 09/08/2015 07/31/14   Porfirio Oarhelle Jeffery, PA-C  esomeprazole (NEXIUM) 40 MG capsule Take 1 capsule (40 mg total) by mouth daily. Patient not taking: Reported on 09/08/2015 06/14/15   Wallis BambergMario Mani, PA-C   Social History   Social History  . Marital Status: Single    Spouse Name: n/a  . Number of Children: 0  . Years of Education: College   Occupational History  . head coach     [employer]  . EC Assistant     [employer]  . Habilitation Technician    Social History Main Topics  . Smoking status: Never Smoker   . Smokeless tobacco: Never  Used  . Alcohol Use: No  . Drug Use: No  . Sexual Activity: No   Other Topics Concern  . Not on file   Social History Narrative   Lives with her grandmother, both parents and two siblings.    Review of Systems  Gastrointestinal: Positive for abdominal pain.  Psychiatric/Behavioral: Positive for decreased concentration.      Objective:   Physical Exam  Constitutional: She is oriented to person, place, and time. She appears well-developed and well-nourished. No distress.  HENT:  Head: Normocephalic and atraumatic.  Eyes: EOM are normal. Pupils are equal,  round, and reactive to light.  Neck: Neck supple. No thyromegaly present.  Cardiovascular: Normal rate, regular rhythm and normal heart sounds.   No murmur heard. Pulmonary/Chest: Effort normal and breath sounds normal.  Abdominal: Soft. Bowel sounds are normal. She exhibits no mass. There is no tenderness.  Lymphadenopathy:    She has no cervical adenopathy.  Neurological: She is alert and oriented to person, place, and time. No cranial nerve deficit.  Skin: Skin is warm and dry.  Psychiatric: She has a normal mood and affect. Her behavior is normal.  Nursing note and vitals reviewed.   BP 100/58 mmHg  Pulse 79  Temp(Src) 97.6 F (36.4 C) (Oral)  Resp 14  Ht 5' 4.5" (1.638 m)  Wt 123 lb 6.4 oz (55.974 kg)  BMI 20.86 kg/m2  SpO2 99%     Assessment & Plan:  ADD (attention deficit disorder) - Plan: amphetamine-dextroamphetamine (ADDERALL) 15 MG tablet, amphetamine-dextroamphetamine (ADDERALL) 15 MG tablet, amphetamine-dextroamphetamine (ADDERALL) 15 MG tablet  Esophageal stricture suggested rather than reflux as cause of atypical chest pain not responding to nexium - Plan: Ambulatory referral to Gastroenterology   Meds ordered this encounter  Medications  . amphetamine-dextroamphetamine (ADDERALL) 15 MG tablet    Sig: Take 1 tablet by mouth 2 (two) times daily.    Dispense:  60 tablet    Refill:  0  . amphetamine-dextroamphetamine (ADDERALL) 15 MG tablet    Sig: Take 1 tablet by mouth 2 (two) times daily. May fill 30 days after date on prescription    Dispense:  60 tablet    Refill:  0  . amphetamine-dextroamphetamine (ADDERALL) 15 MG tablet    Sig: Take 1 tablet by mouth 2 (two) times daily. May fill 60 days after date on prescription    Dispense:  60 tablet    Refill:  0  . pantoprazole (PROTONIX) 40 MG tablet    Sig: Take 1 tablet (40 mg total) by mouth daily.    Dispense:  30 tablet    Refill:  5   F/u ADD 3-4 mos or w/ S Weber after I retire in May

## 2015-09-11 ENCOUNTER — Encounter: Payer: Self-pay | Admitting: Gastroenterology

## 2015-10-02 ENCOUNTER — Ambulatory Visit (INDEPENDENT_AMBULATORY_CARE_PROVIDER_SITE_OTHER): Payer: BC Managed Care – PPO | Admitting: Gastroenterology

## 2015-10-02 ENCOUNTER — Other Ambulatory Visit (INDEPENDENT_AMBULATORY_CARE_PROVIDER_SITE_OTHER): Payer: BC Managed Care – PPO

## 2015-10-02 ENCOUNTER — Encounter: Payer: Self-pay | Admitting: Gastroenterology

## 2015-10-02 VITALS — BP 94/60 | HR 68 | Ht 64.5 in | Wt 126.4 lb

## 2015-10-02 DIAGNOSIS — R110 Nausea: Secondary | ICD-10-CM | POA: Insufficient documentation

## 2015-10-02 DIAGNOSIS — K219 Gastro-esophageal reflux disease without esophagitis: Secondary | ICD-10-CM | POA: Insufficient documentation

## 2015-10-02 DIAGNOSIS — R142 Eructation: Secondary | ICD-10-CM

## 2015-10-02 LAB — COMPREHENSIVE METABOLIC PANEL
ALBUMIN: 4 g/dL (ref 3.5–5.2)
ALT: 19 U/L (ref 0–35)
AST: 14 U/L (ref 0–37)
Alkaline Phosphatase: 34 U/L — ABNORMAL LOW (ref 39–117)
BUN: 7 mg/dL (ref 6–23)
CHLORIDE: 106 meq/L (ref 96–112)
CO2: 29 mEq/L (ref 19–32)
CREATININE: 0.76 mg/dL (ref 0.40–1.20)
Calcium: 8.9 mg/dL (ref 8.4–10.5)
GFR: 119.22 mL/min (ref >60.00)
GLUCOSE: 89 mg/dL (ref 70–99)
POTASSIUM: 4.5 meq/L (ref 3.5–5.1)
SODIUM: 139 meq/L (ref 135–145)
TOTAL PROTEIN: 7 g/dL (ref 6.0–8.3)
Total Bilirubin: 0.3 mg/dL (ref 0.2–1.2)

## 2015-10-02 LAB — CBC WITH DIFFERENTIAL/PLATELET
Basophils Absolute: 0 10*3/uL (ref 0.0–0.1)
Basophils Relative: 0.6 % (ref 0.0–3.0)
EOS ABS: 0 10*3/uL (ref 0.0–0.7)
Eosinophils Relative: 0.5 % (ref 0.0–5.0)
HCT: 39.9 % (ref 36.0–46.0)
HEMOGLOBIN: 12.6 g/dL (ref 12.0–15.0)
LYMPHS ABS: 1.7 10*3/uL (ref 0.7–4.0)
Lymphocytes Relative: 35.4 % (ref 12.0–46.0)
MCHC: 31.7 g/dL (ref 30.0–36.0)
MCV: 81.8 fl (ref 78.0–100.0)
MONO ABS: 0.4 10*3/uL (ref 0.1–1.0)
Monocytes Relative: 9.4 % (ref 3.0–12.0)
NEUTROS PCT: 54.1 % (ref 43.0–77.0)
Neutro Abs: 2.6 10*3/uL (ref 1.4–7.7)
Platelets: 168 10*3/uL (ref 150.0–400.0)
RBC: 4.87 Mil/uL (ref 3.87–5.11)
RDW: 14.2 % (ref 11.5–15.5)
WBC: 4.7 10*3/uL (ref 4.0–10.5)

## 2015-10-02 NOTE — Progress Notes (Signed)
Agree with assessment and plan as outlined.  

## 2015-10-02 NOTE — Patient Instructions (Signed)
Continue Pantoprazole  Your physician has requested that you go to the basement for lab work before leaving today.   You have been scheduled for an endoscopy. Please follow written instructions given to you at your visit today. If you use inhalers (even only as needed), please bring them with you on the day of your procedure. Your physician has requested that you go to www.startemmi.com and enter the access code given to you at your visit today. This web site gives a general overview about your procedure. However, you should still follow specific instructions given to you by our office regarding your preparation for the procedure.

## 2015-10-02 NOTE — Progress Notes (Signed)
     10/02/2015 Maureen Cantrell [id number] 11/16/1990   HISTORY OF PRESENT ILLNESS:  This is a 25 year old female who is new to our practice. She presents to our office today with complaints of heartburn/reflux with belching and nausea. She also complains of some epigastric discomfort. She says that this has been going on for the past several months and has been worsening. Prior to starting several months ago she did not have any similar symptoms in the past.  She says that when she belches sometimes she will bring him foam. Her abdominal pain is described as soreness in her epigastrium that is constantly present. She denies any dysphasia. She was previously on Nexium and Pepcid with no improvement in her symptoms. She is currently on pantoprazole 40 mg daily for the past 3 weeks and has not seen any improvement in her symptoms with that either. She does smoke and admits to eating spicy foods.  She drinks some coffee and soda.   Past Medical History  Diagnosis Date  . Allergy   . ADD (attention deficit disorder)    History reviewed. No pertinent past surgical history.  reports that she has never smoked. She has never used smokeless tobacco. She reports that she does not drink alcohol or use illicit drugs. family history includes Diabetes in her maternal grandfather. No Known Allergies    Outpatient Encounter Prescriptions as of 10/02/2015  Medication Sig  . amphetamine-dextroamphetamine (ADDERALL) 15 MG tablet Take 1 tablet by mouth 2 (two) times daily. May fill 60 days after date on prescription  . pantoprazole (PROTONIX) 40 MG tablet Take 1 tablet (40 mg total) by mouth daily.  . [DISCONTINUED] amphetamine-dextroamphetamine (ADDERALL) 15 MG tablet Take 1 tablet by mouth 2 (two) times daily.  . [DISCONTINUED] amphetamine-dextroamphetamine (ADDERALL) 15 MG tablet Take 1 tablet by mouth 2 (two) times daily. May fill 30 days after date on prescription  . [DISCONTINUED] esomeprazole (NEXIUM)  40 MG capsule Take 1 capsule (40 mg total) by mouth daily. (Patient not taking: Reported on 09/08/2015)  . [DISCONTINUED] famotidine (PEPCID) 20 MG tablet Take 1 tablet (20 mg total) by mouth 2 (two) times daily.   No facility-administered encounter medications on file as of 10/02/2015.     REVIEW OF SYSTEMS  : All other systems reviewed and negative except where noted in the History of Present Illness.   PHYSICAL EXAM: BP 94/60 mmHg  Pulse 68  Ht 5' 4.5" (1.638 m)  Wt 126 lb 6.4 oz (57.335 kg)  BMI 21.37 kg/m2  LMP 09/15/2015 General: Well developed black female in no acute distress Head: Normocephalic and atraumatic Eyes:  Sclerae anicteric, conjunctiva pink. Ears: Normal auditory acuity Lungs: Clear throughout to auscultation Heart: Regular rate and rhythm Abdomen: Soft, non-distended.  Normal bowel sounds.  Mild epigastric TTP without R/R/G. Musculoskeletal: Symmetrical with no gross deformities  Skin: No lesions on visible extremities Extremities: No edema  Neurological: Alert oriented x 4, grossly non-focal Psychological:  Alert and cooperative. Normal mood and affect  ASSESSMENT AND PLAN: -GERD, belching, nausea, epigastric discomfort:  No improvement on PPI therapy. Will schedule EGD for further evaluation. She will continue her pantoprazole 40 mg daily for now. Also discussed with her GERD dietary/lifestyle changes.  The risks, benefits, and alternatives to EGD were discussed with the patient and she consents to proceed.  Will also check CBC and CMP.  ? Gallbladder source, but seems much less likely.  CC:  Tonye Pearson, MD

## 2015-10-26 ENCOUNTER — Encounter: Payer: Self-pay | Admitting: Gastroenterology

## 2015-10-26 ENCOUNTER — Ambulatory Visit (AMBULATORY_SURGERY_CENTER): Payer: BC Managed Care – PPO | Admitting: Gastroenterology

## 2015-10-26 VITALS — BP 98/63 | HR 65 | Temp 97.4°F | Resp 16 | Ht 64.5 in | Wt 126.0 lb

## 2015-10-26 DIAGNOSIS — K219 Gastro-esophageal reflux disease without esophagitis: Secondary | ICD-10-CM

## 2015-10-26 DIAGNOSIS — R1013 Epigastric pain: Secondary | ICD-10-CM

## 2015-10-26 MED ORDER — SODIUM CHLORIDE 0.9 % IV SOLN: 500.0000 mL | INTRAVENOUS | Status: DC

## 2015-10-26 NOTE — Progress Notes (Signed)
Called to room to assist during endoscopic procedure.  Patient ID and intended procedure confirmed with present staff. Received instructions for my participation in the procedure from the performing physician.  

## 2015-10-26 NOTE — Progress Notes (Signed)
To recovery. Report to Tyrell, RN, VSS

## 2015-10-26 NOTE — Patient Instructions (Signed)
YOU HAD AN ENDOSCOPIC PROCEDURE TODAY AT THE [facility]:   Refer to the procedure report that was given to you for any specific questions about what was found during the examination.  If the procedure report does not answer your questions, please call your gastroenterologist to clarify.  If you requested that your care partner not be given the details of your procedure findings, then the procedure report has been included in a sealed envelope for you to review at your convenience later.  YOU SHOULD EXPECT: Some feelings of bloating in the abdomen. Passage of more gas than usual.  Walking can help get rid of the air that was put into your GI tract during the procedure and reduce the bloating. If you had a lower endoscopy (such as a colonoscopy or flexible sigmoidoscopy) you may notice spotting of blood in your stool or on the toilet paper. If you underwent a bowel prep for your procedure, you may not have a normal bowel movement for a few days.  Please Note:  You might notice some irritation and congestion in your nose or some drainage.  This is from the oxygen used during your procedure.  There is no need for concern and it should clear up in a day or so.  SYMPTOMS TO REPORT IMMEDIATELY:    Following upper endoscopy (EGD)  Vomiting of blood or coffee ground material  New chest pain or pain under the shoulder blades  Painful or persistently difficult swallowing  New shortness of breath  Fever of 100F or higher  Black, tarry-looking stools  For urgent or emergent issues, a gastroenterologist can be reached at any hour by calling [phone].   DIET: Your first meal following the procedure should be a small meal and then it is ok to progress to your normal diet. Heavy or fried foods are harder to digest and may make you feel nauseous or bloated.  Likewise, meals heavy in dairy and vegetables can increase bloating.  Drink plenty of fluids but you should avoid alcoholic beverages  for 24 hours.  ACTIVITY:  You should plan to take it easy for the rest of today and you should NOT DRIVE or use heavy machinery until tomorrow (because of the sedation medicines used during the test).    FOLLOW UP: Our staff will call the number listed on your records the next business day following your procedure to check on you and address any questions or concerns that you may have regarding the information given to you following your procedure. If we do not reach you, we will leave a message.  However, if you are feeling well and you are not experiencing any problems, there is no need to return our call.  We will assume that you have returned to your regular daily activities without incident.  If any biopsies were taken you will be contacted by phone or by letter within the next 1-3 weeks.  Please call us at [phone] if you have not heard about the biopsies in 3 weeks.    SIGNATURES/CONFIDENTIALITY: You and/or your care partner have signed paperwork which will be entered into your electronic medical record.  These signatures attest to the fact that that the information above on your After Visit Summary has been reviewed and is understood.  Full responsibility of the confidentiality of this discharge information lies with you and/or your care-partner. 

## 2015-10-26 NOTE — Op Note (Signed)
[facility] [address] [place], [address]   ENDOSCOPY PROCEDURE REPORT  PATIENT: Maureen Cantrell, Maureen Cantrell  MR#: [id number] BIRTHDATE: 05/12/1991 , 25  yrs. old GENDER: female ENDOSCOPIST: Benancio Deeds, MD REFERRED BY: PROCEDURE DATE:  10/26/2015 PROCEDURE:  EGD w/ biopsy ASA CLASS:     Class II INDICATIONS:  dyspepsia, heartburn, belching, nausea. No improvement with PPI to date MEDICATIONS: Propofol 300 mg IV TOPICAL ANESTHETIC:  DESCRIPTION OF PROCEDURE: After the risks benefits and alternatives of the procedure were thoroughly explained, informed consent was obtained.  The [id number] endoscope was introduced through the mouth and advanced to the second portion of the duodenum , Without limitations.  The instrument was slowly withdrawn as the mucosa was fully examined.    FINDINGS: The esophagus was normal.  DH, GEJ, and SCJ were located 37cm from the incisors.  The stomach was normal in appearance without evidence of inflammatory changes or erosions / ulcerations. Biopsies were taken from the antrum and body to rule out H pylori. The duodenal bulb and second portion of the duodenum were normal. Retroflexed views revealed no abnormalities.     The scope was then withdrawn from the patient and the procedure completed.  COMPLICATIONS: There were no immediate complications.  ENDOSCOPIC IMPRESSION: Normal upper endoscopy - biopsies taken to rule out H pylori  RECOMMENDATIONS: Resume diet Resume medications Await pathology results. Further recommendations pending pathology results.   eSigned:  Benancio Deeds, MD 10/26/2015 3:18 PM    CC: the patient

## 2015-10-29 ENCOUNTER — Telehealth: Payer: Self-pay

## 2015-10-29 NOTE — Telephone Encounter (Signed)
  Follow up Call-  Call back number 10/26/2015  Post procedure Call Back phone  # [phone]  Permission to leave phone message Yes     Patient questions:  Do you have a fever, pain , or abdominal swelling? No. Pain Score  0 *  Have you tolerated food without any problems? Yes.    Have you been able to return to your normal activities? Yes.    Do you have any questions about your discharge instructions: Diet   No. Medications  No. Follow up visit  No.  Do you have questions or concerns about your Care? No.  Actions: * If pain score is 4 or above: No action needed, pain <4.

## 2015-11-07 ENCOUNTER — Ambulatory Visit (INDEPENDENT_AMBULATORY_CARE_PROVIDER_SITE_OTHER): Payer: BC Managed Care – PPO | Admitting: Urgent Care

## 2015-11-07 VITALS — BP 116/64 | HR 58 | Temp 98.2°F | Resp 14 | Ht 64.0 in | Wt 125.6 lb

## 2015-11-07 DIAGNOSIS — R599 Enlarged lymph nodes, unspecified: Secondary | ICD-10-CM | POA: Diagnosis not present

## 2015-11-07 DIAGNOSIS — R591 Generalized enlarged lymph nodes: Secondary | ICD-10-CM

## 2015-11-07 NOTE — Patient Instructions (Addendum)
-   Use  every 6-8 hours with food to help as a non-steroidal anti-inflammatory.   Lymphadenopathy Lymphadenopathy refers to swollen or enlarged lymph glands, also called lymph nodes. Lymph glands are part of your body's defense (immune) system, which protects the body from infections, germs, and diseases. Lymph glands are found in many locations in your body, including the neck, underarm, and groin.  Many things can cause lymph glands to become enlarged. When your immune system responds to germs, such as viruses or bacteria, infection-fighting cells and fluid build up. This causes the glands to grow in size. Usually, this is not something to worry about. The swelling and any soreness often go away without treatment. However, swollen lymph glands can also be caused by a number of diseases. Your health care provider may do various tests to help determine the cause. If the cause of your swollen lymph glands cannot be found, it is important to monitor your condition to make sure the swelling goes away. HOME CARE INSTRUCTIONS Watch your condition for any changes. The following actions may help to lessen any discomfort you are feeling:  Get plenty of rest.  Take medicines only as directed by your health care provider. Your health care provider may recommend over-the-counter medicines for pain.  Apply moist heat compresses to the site of swollen lymph nodes as directed by your health care provider. This can help reduce any pain.  Check your lymph nodes daily for any changes.  Keep all follow-up visits as directed by your health care provider. This is important. SEEK MEDICAL CARE IF:  Your lymph nodes are still swollen after 2 weeks.  Your swelling increases or spreads to other areas.  Your lymph nodes are hard, seem fixed to the skin, or are growing rapidly.  Your skin over the lymph nodes is red and inflamed.  You have a fever.  You have chills.  You have fatigue.  You develop a sore  throat.  You have abdominal pain.  You have weight loss.  You have night sweats. SEEK IMMEDIATE MEDICAL CARE IF:  You notice fluid leaking from the area of the enlarged lymph node.  You have severe pain in any area of your body.  You have chest pain.  You have shortness of breath.   This information is not intended to replace advice given to you by your health care provider. Make sure you discuss any questions you have with your health care provider.   Document Released: 06/10/2008 Document Revised: 09/22/2014 Document Reviewed: 04/06/2014 Elsevier Interactive Patient Education [employer]

## 2015-11-07 NOTE — Progress Notes (Addendum)
    MRN: [id number] DOB: [birth year]  Subjective:   Maureen Cantrell is a 25 y.o. female presenting for chief complaint of Lymphadenopathy  Reports longstanding history of lymphadenopathy. Generally she has one lymph node that becomes inflamed when she gets ill, it's over her occiput. However, in the past week she has had multiple cervical lymph nodes enlarge and become tender. She has also had a slight cough, left-sided sharp and transient ear pain. Denies fever, night sweats, weight loss, rashes, n/v, abdominal pain. Denies smoking cigarettes. Drinks alcohol occasionally.  Mailey has a current medication list which includes the following prescription(s): amphetamine-dextroamphetamine and pantoprazole. Also has No Known Allergies.  Maureen Cantrell  has a past medical history of Allergy; ADD (attention deficit disorder); and GERD (gastroesophageal reflux disease). Also  has no past surgical history on file.  Her family history includes Colon cancer in her maternal grandmother; Diabetes in her maternal grandfather.   Objective:   Vitals: BP 116/64 mmHg  Pulse 58  Temp(Src) 98.2 F (36.8 C) (Oral)  Resp 14  Ht  (1.626 m)  Wt 125 lb 9.6 oz (56.972 kg)  BMI 21.55 kg/m2  SpO2 95%  Wt Readings from Last 3 Encounters:  11/07/15 125 lb 9.6 oz (56.972 kg)  10/26/15 126 lb (57.153 kg)  10/02/15 126 lb 6.4 oz (57.335 kg)   Physical Exam  Constitutional: She is oriented to person, place, and time. She appears well-developed and well-nourished.  HENT:  Mouth/Throat: Oropharynx is clear and moist.  Eyes: Right eye exhibits no discharge. Left eye exhibits no discharge. No scleral icterus.  Neck: Normal range of motion. Neck supple.  Cardiovascular: Normal rate, regular rhythm and intact distal pulses.  Exam reveals no gallop and no friction rub.   No murmur heard. Pulmonary/Chest: No respiratory distress. She has no wheezes. She has no rales.  Abdominal: Soft. Bowel sounds are normal. She  exhibits no distension and no mass. There is no tenderness.  Musculoskeletal: She exhibits no edema.  Lymphadenopathy:       Head (right side): Occipital adenopathy present. No submental, no submandibular, no preauricular and no posterior auricular adenopathy present.       Head (left side): No submental, no submandibular, no preauricular, no posterior auricular and no occipital adenopathy present.    She has cervical adenopathy.       Left cervical: Superficial cervical (at base of neck) adenopathy present.    She has no axillary adenopathy.       Right: No inguinal and no supraclavicular adenopathy present.       Left: Supraclavicular (over mid-proximarl clavicle) adenopathy present. No inguinal adenopathy present.  Neurological: She is alert and oriented to person, place, and time.  Skin: Skin is warm and dry.  Psychiatric: She has a normal mood and affect.   Assessment and Plan :   1. Lymphadenopathy of head and neck - Unclear etiology but HPI is very reassuring. Will obtain US of cervical and supraclavicular lymph nodes. Labs pending. Follow up in 2 weeks.  Wallis Bamberg, PA-C [facility] [phone] 11/07/2015 5:38 PM   I have reviewed and agree with documentation, diagnosis and plan. Robert P. Merla Riches, M.D.

## 2015-11-07 NOTE — Addendum Note (Signed)
Addended by: Garfield Cornea L on: 11/07/2015 07:40 PM   Modules accepted: Orders

## 2015-11-08 LAB — COMPREHENSIVE METABOLIC PANEL
ALBUMIN: 4.5 g/dL (ref 3.6–5.1)
ALT: 14 U/L (ref 6–29)
AST: 15 U/L (ref 10–30)
Alkaline Phosphatase: 38 U/L (ref 33–115)
BUN: 10 mg/dL (ref 7–25)
CALCIUM: 9.2 mg/dL (ref 8.6–10.2)
CHLORIDE: 104 mmol/L (ref 98–110)
CO2: 22 mmol/L (ref 20–31)
Creat: 0.76 mg/dL (ref 0.50–1.10)
GLUCOSE: 83 mg/dL (ref 65–99)
POTASSIUM: 4.1 mmol/L (ref 3.5–5.3)
SODIUM: 139 mmol/L (ref 135–146)
Total Bilirubin: 0.2 mg/dL (ref 0.2–1.2)
Total Protein: 7.7 g/dL (ref 6.1–8.1)

## 2015-11-08 LAB — CBC WITH DIFFERENTIAL/PLATELET
BASOS ABS: 0 10*3/uL (ref 0.0–0.1)
BASOS PCT: 1 % (ref 0–1)
Eosinophils Absolute: 0 10*3/uL (ref 0.0–0.7)
Eosinophils Relative: 1 % (ref 0–5)
HCT: 39.1 % (ref 36.0–46.0)
HEMOGLOBIN: 12.8 g/dL (ref 12.0–15.0)
Lymphocytes Relative: 52 % — ABNORMAL HIGH (ref 12–46)
Lymphs Abs: 2.1 10*3/uL (ref 0.7–4.0)
MCH: 26.6 pg (ref 26.0–34.0)
MCHC: 32.7 g/dL (ref 30.0–36.0)
MCV: 81.1 fL (ref 78.0–100.0)
MPV: 11.4 fL (ref 8.6–12.4)
Monocytes Absolute: 0.5 10*3/uL (ref 0.1–1.0)
Monocytes Relative: 12 % (ref 3–12)
NEUTROS ABS: 1.4 10*3/uL — AB (ref 1.7–7.7)
NEUTROS PCT: 34 % — AB (ref 43–77)
Platelets: 165 10*3/uL (ref 150–400)
RBC: 4.82 MIL/uL (ref 3.87–5.11)
RDW: 15 % (ref 11.5–15.5)
WBC: 4 10*3/uL (ref 4.0–10.5)

## 2015-11-09 ENCOUNTER — Encounter: Payer: Self-pay | Admitting: Urgent Care

## 2015-11-09 LAB — SEDIMENTATION RATE: SED RATE: 1 mm/h (ref 0–20)

## 2015-11-09 LAB — EPSTEIN-BARR VIRUS VCA ANTIBODY PANEL
EBV EA IgG: <5 U/mL (ref <9.0)
EBV NA IgG: <3 U/mL (ref <18.0)
EBV VCA IgM: 42.9 U/mL — ABNORMAL HIGH (ref <36.0)

## 2015-11-17 ENCOUNTER — Telehealth: Payer: Self-pay

## 2015-11-17 DIAGNOSIS — F988 Other specified behavioral and emotional disorders with onset usually occurring in childhood and adolescence: Secondary | ICD-10-CM

## 2015-11-17 NOTE — Telephone Encounter (Signed)
Pt is needing a refill on her adderall   Best number [phone]

## 2015-11-20 MED ORDER — AMPHETAMINE-DEXTROAMPHETAMINE 15 MG PO TABS: 15.0000 mg | ORAL_TABLET | Freq: Two times a day (BID) | ORAL | Status: DC

## 2015-11-20 NOTE — Telephone Encounter (Signed)
Meds ordered this encounter  Medications  . amphetamine-dextroamphetamine (ADDERALL) 15 MG tablet    Sig: Take 1 tablet by mouth 2 (two) times daily. May fill 60 days after date on prescription    Dispense:  60 tablet    Refill:  0  . amphetamine-dextroamphetamine (ADDERALL) 15 MG tablet    Sig: Take 1 tablet by mouth 2 (two) times daily. Fill 30d after signed    Dispense:  60 tablet    Refill:  0  . amphetamine-dextroamphetamine (ADDERALL) 15 MG tablet    Sig: Take 1 tablet by mouth 2 (two) times daily.    Dispense:  60 tablet    Refill:  0

## 2015-11-21 ENCOUNTER — Telehealth: Payer: Self-pay

## 2015-11-21 NOTE — Telephone Encounter (Signed)
PA started for adderall 15 mg on covermymeds. Waiting for clin ?s to load.

## 2015-11-21 NOTE — Telephone Encounter (Signed)
Rx in pick up draw. 

## 2015-11-22 ENCOUNTER — Ambulatory Visit
Admission: RE | Admit: 2015-11-22 | Discharge: 2015-11-22 | Disposition: A | Discharge status: Home / Self Care | Payer: BC Managed Care – PPO | Source: Ambulatory Visit | Attending: Urgent Care | Admitting: Urgent Care

## 2015-11-22 DIAGNOSIS — R591 Generalized enlarged lymph nodes: Secondary | ICD-10-CM

## 2015-11-22 NOTE — Telephone Encounter (Signed)
PA approved on covermymeds. Notified pharm.

## 2015-11-23 NOTE — Telephone Encounter (Signed)
Approval through 11/21/2018.

## 2015-11-26 ENCOUNTER — Telehealth: Payer: Self-pay

## 2016-01-21 ENCOUNTER — Telehealth: Payer: Self-pay

## 2016-01-21 DIAGNOSIS — F988 Other specified behavioral and emotional disorders with onset usually occurring in childhood and adolescence: Secondary | ICD-10-CM

## 2016-01-21 MED ORDER — AMPHETAMINE-DEXTROAMPHETAMINE 15 MG PO TABS: 15.0000 mg | ORAL_TABLET | Freq: Two times a day (BID) | ORAL | Status: DC

## 2016-01-21 MED ORDER — AMPHETAMINE-DEXTROAMPHETAMINE 15 MG PO TABS
15.0000 mg | ORAL_TABLET | Freq: Two times a day (BID) | ORAL | Status: DC
Start: 2016-01-21 — End: 2016-06-13

## 2016-01-21 NOTE — Telephone Encounter (Signed)
Notified pt ready. 

## 2016-01-21 NOTE — Telephone Encounter (Signed)
[place] - Pt never came to pick up her prescription of adderall.  She was confused and thought that because her insurance had approved it that it would automatically be sent to the pharmacy.  She says she needs a 3 month supply.  Call her at [phone]

## 2016-01-21 NOTE — Telephone Encounter (Signed)
Meds ordered this encounter  Medications  . amphetamine-dextroamphetamine (ADDERALL) 15 MG tablet    Sig: Take 1 tablet by mouth 2 (two) times daily. May fill 60 days after date on prescription    Dispense:  60 tablet    Refill:  0  . amphetamine-dextroamphetamine (ADDERALL) 15 MG tablet    Sig: Take 1 tablet by mouth 2 (two) times daily. Fill 30d after signed    Dispense:  60 tablet    Refill:  0  . amphetamine-dextroamphetamine (ADDERALL) 15 MG tablet    Sig: Take 1 tablet by mouth 2 (two) times daily.    Dispense:  60 tablet    Refill:  0

## 2016-06-13 ENCOUNTER — Ambulatory Visit (INDEPENDENT_AMBULATORY_CARE_PROVIDER_SITE_OTHER): Payer: BC Managed Care – PPO | Admitting: Physician Assistant

## 2016-06-13 DIAGNOSIS — F909 Attention-deficit hyperactivity disorder, unspecified type: Secondary | ICD-10-CM | POA: Diagnosis not present

## 2016-06-13 DIAGNOSIS — F988 Other specified behavioral and emotional disorders with onset usually occurring in childhood and adolescence: Secondary | ICD-10-CM

## 2016-06-13 MED ORDER — AMPHETAMINE-DEXTROAMPHETAMINE 15 MG PO TABS: 15.0000 mg | ORAL_TABLET | Freq: Two times a day (BID) | ORAL | 0 refills | Status: DC

## 2016-06-13 NOTE — Patient Instructions (Signed)
     IF you received an x-ray today, you will receive an invoice from [facility]. Please contact [facility] at [phone] with questions or concerns regarding your invoice.   IF you received labwork today, you will receive an invoice from [facility]. Please contact [facility] at [phone] with questions or concerns regarding your invoice.   Our billing staff will not be able to assist you with questions regarding bills from these companies.  You will be contacted with the lab results as soon as they are available. The fastest way to get your results is to activate your My Chart account. Instructions are located on the last page of this paperwork. If you have not heard from us regarding the results in 2 weeks, please contact this office.      

## 2016-06-13 NOTE — Progress Notes (Signed)
06/13/2016 4:41 PM   DOB: 10-Nov-1990 / MRN: [id number]  SUBJECTIVE:  Maureen Cantrell is a 25 y.o. female presenting for for ADD medication refills.  She was in college at the time she was diagnosed, roughly 7 years ago.  She was diagnosed by psychology.  That intake is not available in [facility]. 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 Reports that she can not focus well without her medication. She takes 15 mg early in the morning and then takes her next daily dose around noon and one.  She tells me that Maureen Cantrell is her new provider but she has been unable to schedule with her as the patient has been busy with work and school.  She is okay with seeing Maureen Cantrell and only Maureen Cantrell for future refill.    She has No Known Allergies.   She  has a past medical history of ADD (attention deficit disorder); Allergy; and GERD (gastroesophageal reflux disease).    She  reports that she quit smoking about 4 months ago. Her smoking use included Cigarettes. She quit after 3.00 years of use. She has never used smokeless tobacco. She reports that she drinks alcohol. She reports that she does not use drugs. She  reports that she does not engage in sexual activity. The patient  has no past surgical history on file.  Her family history includes Colon cancer in her maternal grandmother; Diabetes in her maternal grandfather.  Review of Systems  Constitutional: Negative for chills and fever.  Cardiovascular: Negative for chest pain.  Gastrointestinal: Negative for nausea.  Skin: Negative for rash.  Neurological: Negative for dizziness and headaches.    The problem list and medications were reviewed and updated by myself where necessary and exist elsewhere in the encounter.   OBJECTIVE:  BP 122/72 (BP Location: Right Arm, Patient Position: Sitting, Cuff Size: Normal)   Pulse 60   Temp 98.5 F (36.9 C) (Oral)   Resp 17   Ht 5' 4.5" (1.638 m)   Wt 124 lb (56.2 kg)   LMP  05/16/2016 (Approximate)   SpO2 100%   BMI 20.96 kg/m   Physical Exam  Constitutional: She is oriented to person, place, and time. She appears well-nourished. No distress.  Eyes: EOM are normal. Pupils are equal, round, and reactive to light.  Cardiovascular: Normal rate.   Pulmonary/Chest: Effort normal.  Abdominal: She exhibits no distension.  Neurological: She is alert and oriented to person, place, and time. No cranial nerve deficit. Gait normal.  Skin: Skin is dry. She is not diaphoretic.  Psychiatric: She has a normal mood and affect.  Vitals reviewed.   NCCRS checked and negative for suspicious activity.   No results found for this or any previous visit (from the past 72 hour(s)).  No results found.  ASSESSMENT AND PLAN  Maureen Cantrell was seen today for medication refill.  Diagnoses and all orders for this visit:  ADD (attention deficit disorder): Patient reporting she will see Maureen Cantrell and only Maureen Cantrell for future refills.  I have given her 90 days and advised that she schedule an appointment with Maureen Cantrell in about 90 days.  -     amphetamine-dextroamphetamine (ADDERALL) 15 MG tablet; Take 1 tablet by mouth 2 (two) times daily. May fill 60 days after date on prescription -     amphetamine-dextroamphetamine (ADDERALL) 15 MG tablet; Take 1 tablet by mouth 2 (two) times daily. Fill 30d after signed -  amphetamine-dextroamphetamine (ADDERALL) 15 MG tablet; Take 1 tablet by mouth 2 (two) times daily.    The patient is advised to call or return to clinic if she does not see an improvement in symptoms, or to seek the care of the closest emergency department if she worsens with the above plan.   Deliah BostonMichael Talayla Doyel, MHS, PA-C [facility] 06/13/2016 4:41 PM

## 2016-09-27 ENCOUNTER — Other Ambulatory Visit: Payer: Self-pay

## 2016-09-27 NOTE — Telephone Encounter (Signed)
Patient is requesting adderall refill.   She is completely out  Call when ready for pick up . [phone] (M)

## 2016-09-28 NOTE — Telephone Encounter (Signed)
06/13/2016 last ov and refill x 3 months

## 2016-09-29 ENCOUNTER — Telehealth: Payer: Self-pay

## 2016-09-29 DIAGNOSIS — F988 Other specified behavioral and emotional disorders with onset usually occurring in childhood and adolescence: Secondary | ICD-10-CM

## 2016-09-29 NOTE — Telephone Encounter (Signed)
[facility]

## 2016-09-29 NOTE — Telephone Encounter (Signed)
RTC to see Maureen LennertSarah Weber only.  This was made overtly clear to patient at her last visit and I had asked her to make an appointment with Ms. Weber at that time.

## 2016-10-03 ENCOUNTER — Telehealth: Payer: Self-pay

## 2016-10-03 MED ORDER — AMPHETAMINE-DEXTROAMPHETAMINE 15 MG PO TABS: 15.0000 mg | ORAL_TABLET | Freq: Two times a day (BID) | ORAL | 0 refills | Status: DC

## 2016-10-03 NOTE — Telephone Encounter (Signed)
I will refill this for a month - she will need to make an appt at 104 with me before this runs out.

## 2016-10-03 NOTE — Telephone Encounter (Signed)
We received a call from this number [phone] that we are calling the wrong number for this patient

## 2016-10-03 NOTE — Telephone Encounter (Signed)
rx up front for pick up LMOVM for pt to make appt with Sarah prior to running out (at 104)

## 2016-11-29 ENCOUNTER — Ambulatory Visit (INDEPENDENT_AMBULATORY_CARE_PROVIDER_SITE_OTHER): Payer: BC Managed Care – PPO | Admitting: Physician Assistant

## 2016-11-29 DIAGNOSIS — K219 Gastro-esophageal reflux disease without esophagitis: Secondary | ICD-10-CM

## 2016-11-29 DIAGNOSIS — F988 Other specified behavioral and emotional disorders with onset usually occurring in childhood and adolescence: Secondary | ICD-10-CM

## 2016-11-29 MED ORDER — AMPHETAMINE-DEXTROAMPHETAMINE 15 MG PO TABS: 15.0000 mg | ORAL_TABLET | Freq: Two times a day (BID) | ORAL | 0 refills | Status: DC

## 2016-11-29 NOTE — Patient Instructions (Signed)
     IF you received an x-ray today, you will receive an invoice from [facility]. Please contact [facility] at [phone] with questions or concerns regarding your invoice.   IF you received labwork today, you will receive an invoice from [facility]. Please contact [facility] at [phone] with questions or concerns regarding your invoice.   Our billing staff will not be able to assist you with questions regarding bills from these companies.  You will be contacted with the lab results as soon as they are available. The fastest way to get your results is to activate your My Chart account. Instructions are located on the last page of this paperwork. If you have not heard from us regarding the results in 2 weeks, please contact this office.     

## 2016-11-29 NOTE — Progress Notes (Signed)
   Maureen Cantrell  MRN: [id number] DOB: 10/20/1990  PCP: Virgilio BellingWEBER,Kaelani Kendrick, PA-C  Chief Complaint  Patient presents with  . Follow-up    Adderall    Subjective:  Pt presents to clinic for medication refill.  She takes it bid during work and then on her days off she takes it only if she needs to get something done.  She feels like her current dose works well for her symptoms.  Review of Systems  Constitutional: Negative for appetite change, chills, fever and unexpected weight change.  Cardiovascular: Negative for chest pain and palpitations.  Psychiatric/Behavioral: Negative for sleep disturbance.    Patient Active Problem List   Diagnosis Date Noted  . Esophageal reflux 10/02/2015  . ADD (attention deficit disorder) 11/25/2011    No current outpatient prescriptions on file prior to visit.   No current facility-administered medications on file prior to visit.     No Known Allergies  Pt patients past, family and social history were reviewed and updated.   Objective:  BP 92/64   Pulse 67   Temp 98 F (36.7 C) (Oral)   Resp 16   Ht 5\' 5"  (1.651 m)   Wt 120 lb 12.8 oz (54.8 kg)   LMP 11/17/2016   SpO2 100%   BMI 20.10 kg/m   Physical Exam  Constitutional: She is oriented to person, place, and time and well-developed, well-nourished, and in no distress.  HENT:  Head: Normocephalic and atraumatic.  Right Ear: Hearing and external ear normal.  Left Ear: Hearing and external ear normal.  Eyes: Conjunctivae are normal.  Neck: Normal range of motion.  Cardiovascular: Normal rate, regular rhythm and normal heart sounds.   No murmur heard. Pulmonary/Chest: Effort normal and breath sounds normal. She has no wheezes.  Neurological: She is alert and oriented to person, place, and time. Gait normal.  Skin: Skin is warm and dry.  Psychiatric: Mood, memory, affect and judgment normal.  Vitals reviewed.   Assessment and Plan :  Gastroesophageal reflux disease without  esophagitis  Attention deficit disorder (ADD) without hyperactivity - Plan: amphetamine-dextroamphetamine (ADDERALL) 15 MG tablet, amphetamine-dextroamphetamine (ADDERALL) 15 MG tablet  Attention deficit disorder, unspecified hyperactivity presence - Plan: amphetamine-dextroamphetamine (ADDERALL) 15 MG tablet   Controlled substance contract signed.  Reviewed [place] database - appropriate use.  Benny LennertSarah Lynnex Fulp PA-C  [facility] 11/29/2016 10:41 AM

## 2017-03-10 ENCOUNTER — Telehealth: Payer: Self-pay | Admitting: Physician Assistant

## 2017-03-10 DIAGNOSIS — F988 Other specified behavioral and emotional disorders with onset usually occurring in childhood and adolescence: Secondary | ICD-10-CM

## 2017-03-10 NOTE — Telephone Encounter (Signed)
Please advise 

## 2017-03-10 NOTE — Telephone Encounter (Signed)
Pt is requesting a med refill on her amphetamine-dextroamphetamine (ADDERALL) 15 MG tablet .  Contact number [phone]

## 2017-03-11 MED ORDER — AMPHETAMINE-DEXTROAMPHETAMINE 15 MG PO TABS: 15.0000 mg | ORAL_TABLET | Freq: Two times a day (BID) | ORAL | 0 refills | Status: DC

## 2017-03-11 NOTE — Telephone Encounter (Signed)
Rx is at front desk for pick up, unable to lvm pt phone is [phone]

## 2017-03-11 NOTE — Telephone Encounter (Signed)
Done - please advise pt to make an appt with me in 3 months for f/u.

## 2017-06-19 ENCOUNTER — Encounter: Payer: Self-pay | Admitting: Physician Assistant

## 2017-06-19 ENCOUNTER — Ambulatory Visit (INDEPENDENT_AMBULATORY_CARE_PROVIDER_SITE_OTHER): Payer: BC Managed Care – PPO | Admitting: Physician Assistant

## 2017-06-19 VITALS — BP 106/70 | HR 108 | Temp 98.7°F | Resp 18 | Ht 65.0 in | Wt 118.8 lb

## 2017-06-19 DIAGNOSIS — N898 Other specified noninflammatory disorders of vagina: Secondary | ICD-10-CM | POA: Diagnosis not present

## 2017-06-19 DIAGNOSIS — Z23 Encounter for immunization: Secondary | ICD-10-CM | POA: Diagnosis not present

## 2017-06-19 DIAGNOSIS — B373 Candidiasis of vulva and vagina: Secondary | ICD-10-CM

## 2017-06-19 DIAGNOSIS — J029 Acute pharyngitis, unspecified: Secondary | ICD-10-CM

## 2017-06-19 DIAGNOSIS — Z13228 Encounter for screening for other metabolic disorders: Secondary | ICD-10-CM

## 2017-06-19 DIAGNOSIS — B3731 Acute candidiasis of vulva and vagina: Secondary | ICD-10-CM

## 2017-06-19 DIAGNOSIS — F430 Acute stress reaction: Secondary | ICD-10-CM | POA: Diagnosis not present

## 2017-06-19 DIAGNOSIS — F988 Other specified behavioral and emotional disorders with onset usually occurring in childhood and adolescence: Secondary | ICD-10-CM

## 2017-06-19 DIAGNOSIS — K30 Functional dyspepsia: Secondary | ICD-10-CM

## 2017-06-19 DIAGNOSIS — Z01419 Encounter for gynecological examination (general) (routine) without abnormal findings: Secondary | ICD-10-CM | POA: Diagnosis not present

## 2017-06-19 DIAGNOSIS — Z0000 Encounter for general adult medical examination without abnormal findings: Secondary | ICD-10-CM

## 2017-06-19 DIAGNOSIS — Z130 Encounter for screening for diseases of the blood and blood-forming organs and certain disorders involving the immune mechanism: Secondary | ICD-10-CM

## 2017-06-19 DIAGNOSIS — Z13220 Encounter for screening for lipoid disorders: Secondary | ICD-10-CM | POA: Diagnosis not present

## 2017-06-19 DIAGNOSIS — Z202 Contact with and (suspected) exposure to infections with a predominantly sexual mode of transmission: Secondary | ICD-10-CM | POA: Diagnosis not present

## 2017-06-19 DIAGNOSIS — Z114 Encounter for screening for human immunodeficiency virus [HIV]: Secondary | ICD-10-CM | POA: Diagnosis not present

## 2017-06-19 LAB — POCT WET + KOH PREP
TRICH BY WET PREP: ABSENT
YEAST BY WET PREP: ABSENT

## 2017-06-19 MED ORDER — AMPHETAMINE-DEXTROAMPHETAMINE 15 MG PO TABS: 15.0000 mg | ORAL_TABLET | Freq: Two times a day (BID) | ORAL | 0 refills | Status: DC

## 2017-06-19 MED ORDER — RANITIDINE HCL 150 MG PO TABS: 150.0000 mg | ORAL_TABLET | Freq: Two times a day (BID) | ORAL | 2 refills | Status: DC

## 2017-06-19 NOTE — Patient Instructions (Addendum)
     IF you received an x-ray today, you will receive an invoice from [facility]. Please contact [facility] at [phone] with questions or concerns regarding your invoice.   IF you received labwork today, you will receive an invoice from [facility]. Please contact [facility] at [phone] with questions or concerns regarding your invoice.   Our billing staff will not be able to assist you with questions regarding bills from these companies.  You will be contacted with the lab results as soon as they are available. The fastest way to get your results is to activate your My Chart account. Instructions are located on the last page of this paperwork. If you have not heard from [place] regarding the results in 2 weeks, please contact this office.    Start the zantac to help with the heartburn that started after your stomach virus - you will want to use this for 2 weeks and then save the rest in case you have another episode.  It is fine it you want to continue to use baking soda/water drink to help.  I will contact you with your lab results as soon as they are available.   If you have not heard from me in 2 weeks, please contact me.  The fastest way to get your results is to register for My Chart (see the instructions on this printout).  Please download the APP called mychart - then use the text to activate this APP - this will allow you to look at your labs and contact me as well as make appointments to see me in the future.

## 2017-06-19 NOTE — Progress Notes (Addendum)
Maureen Cantrell  MRN: [id number] DOB: December 14, 1990  PCP: Mancel Bale, PA-C  Subjective:  Pt presents to clinic for a CPE.  She is concerned with weight loss.  She really has found that she has no appetite but she is trying to eat anyway - this is not something that has happened with her Adderall.  At the beginning of the month her girlfriend cheated on her with a female and now she is concerned she might have an STD.  She is not having vaginal symptoms but she has researched on google and she is scared.  She also had a sore throat that has mostly gone away.  She engaged in vaginal and oral sex with her female partner without protection.  She would like to have STD testing.  Then 2 weeks ago she developed a stomach virus with nausea, vomitiing and diarrhea.  Her stool has returned to normal.  She is still nauseated though.  After she had several episodes of vomiting she started to have problems with her indigestion - she has used baking soda and water to help it and it has helped but it keeps coming back.  She is also worried about why it is taking so long to go back to normal.  Last dental exam: every 6 months Last vision exam: 2.5 years ago Last pap: a long time ago  Vaccinations - need Tdap - she thinks that she has had her gardasil vaccinations here in the past  Typical meals for patient: 3 meals a day with snacks Typical beverage choices: water, soda, gatorade Exercises: 5 days a week - weight training - plays basketball M, W Sleeps: 8-10 hrs per night and sleeping well   Patient Active Problem List   Diagnosis Date Noted  . Esophageal reflux - currently active 10/02/2015  . ADD (attention deficit disorder) - well controlled on medications - she does not feel like her medication dose needs to be changed. 11/25/2011    Review of Systems  Constitutional: Positive for appetite change (decreased) and unexpected weight change (not planning on losing weight).  HENT: Positive for sore  throat (intermittent).   Eyes: Negative.   Respiratory: Negative.   Cardiovascular: Negative.   Gastrointestinal: Negative.        Heartburn - feels like a gas bubble in her chest that is relieved with burping - and feels like her food is not going down all the way  Endocrine: Negative.   Genitourinary: Negative.  Negative for menstrual problem and vaginal bleeding.  Musculoskeletal: Negative.   Skin: Negative.   Allergic/Immunologic: Negative.   Neurological: Negative.   Hematological: Negative.   Psychiatric/Behavioral: Negative.      No current outpatient prescriptions on file prior to visit.   No current facility-administered medications on file prior to visit.     No Known Allergies  Social History   Social History  . Marital status: Single    Spouse name: n/a  . Number of children: 0  . Years of education: College   Occupational History  .      [employer]  . [place]  . Habilitation Technician    Social History Main Topics  . Smoking status: Former Smoker    Years: 3.00    Types: Cigarettes    Quit date: 02/11/2016  . Smokeless tobacco: Never Used  . Alcohol use 0.0 oz/week     Comment: beer 1-2.wk  . Drug use: No  .  Sexual activity: Yes    Partners: Female    Birth control/ protection: Abstinence   Other Topics Concern  . None   Social History Narrative   Lives with siblings (brother and sister)    History reviewed. No pertinent surgical history.  Family History  Problem Relation Age of Onset  . Diabetes Maternal Grandfather   . Colon cancer Maternal Grandmother        75-80 ?     Objective:  BP 106/70   Pulse (!) 108   Temp 98.7 F (37.1 C) (Oral)   Resp 18   Ht _0  (1.651 m)   Wt 118 lb 12.8 oz (53.9 kg)   LMP 06/10/2017   SpO2 98%   BMI 19.77 kg/m   Physical Exam  Constitutional: She is oriented to person, place, and time and well-developed, well-nourished, and in no distress.    HENT:  Head: Normocephalic and atraumatic.  Right Ear: Hearing and external ear normal.  Left Ear: Hearing and external ear normal.  Eyes: Conjunctivae are normal.  Neck: Normal range of motion.  Cardiovascular: Normal rate, regular rhythm and normal heart sounds.   No murmur heard. Pulmonary/Chest: Effort normal and breath sounds normal. Right breast exhibits no inverted nipple, no mass, no nipple discharge, no skin change and no tenderness. Left breast exhibits no inverted nipple, no mass, no nipple discharge, no skin change and no tenderness. Breasts are symmetrical.  Abdominal: Soft. Normal appearance. There is no tenderness.  Genitourinary: Uterus normal, cervix normal, right adnexa normal, left adnexa normal and vulva normal. Vagina exhibits normal mucosa, no exudate, no lesion and no rugosity. Thick  odorless  white and vaginal discharge found.  Musculoskeletal:       Right lower leg: She exhibits no edema.       Left lower leg: She exhibits no edema.  Neurological: She is alert and oriented to person, place, and time. Gait normal.  Skin: Skin is warm and dry.  Tattoos present  Psychiatric: Mood, memory, affect and judgment normal.  Vitals reviewed.   Wt Readings from Last 3 Encounters:  06/19/17 118 lb 12.8 oz (53.9 kg)  11/29/16 120 lb 12.8 oz (54.8 kg)  06/13/16 124 lb (56.2 kg)     Visual Acuity Screening   Right eye Left eye Both eyes  Without correction: 20/10-1 20/10-1 20/10-1  With correction:       Assessment and Plan :  Annual physical exam - sign up for mychart to be able to communicate with me.  Attention deficit disorder, unspecified hyperactivity presence - Plan: amphetamine-dextroamphetamine (ADDERALL) 15 MG tablet - refilled medication - this has never decreased her appetite in the past  Encounter for gynecological examination without abnormal finding - Plan: Pap IG, CT/NG w/ reflex HPV when ASC-U - check labs  Vaginal discharge - Plan: Trichomonas  vaginalis, RNA, POCT Wet + KOH Prep, CANCELED: WET PREP FOR [place], YEAST, CLUE - check labs  Screening cholesterol level - Plan: Lipid panel  Screening for metabolic disorder - Plan: CMP14+EGFR  Screening for deficiency anemia - Plan: CBC with Differential/Platelet  Screening for HIV (human immunodeficiency virus) - Plan: HIV antibody  Need for Tdap vaccination - Plan: Tdap vaccine greater than or equal to 7yo IM  Indigestion - Plan: ranitidine (ZANTAC) 150 MG tablet - likely related to recent GI illness with vomiting will treat for 2 weeks to stop the symptoms and   Exposure to STD - Plan: Chlamydia culture, Gonococcus culture  Sore throat -  Plan: Chlamydia culture, Gonococcus culture  Attention deficit disorder (ADD) without hyperactivity - Plan: amphetamine-dextroamphetamine (ADDERALL) 15 MG tablet, amphetamine-dextroamphetamine (ADDERALL) 15 MG tablet - call in 3 months to get next set of Rx - recheck in 6 months  Stress response - likely leading to weight loss - stress is causing nausea and decreased appetite - we discussed ways to increase weight with increase in calories vs decreased in activity -- I suspect when we get her labs results she will be able to relax from her stress.  She was encouraged to not google symptoms and their likely causes.  Yeast vaginitis - diflucan 135m #1 dose  SWindell HummingbirdPA-C  [facility]/05/2017 4:29 PM

## 2017-06-19 NOTE — Addendum Note (Signed)
Addended by: Morrell Riddle on: 06/19/2017 02:08 PM   Modules accepted: Orders

## 2017-06-20 LAB — LIPID PANEL
Chol/HDL Ratio: 3.2 ratio (ref 0.0–4.4)
Cholesterol, Total: 189 mg/dL (ref 100–199)
HDL: 60 mg/dL (ref >39)
LDL Calculated: 115 mg/dL — ABNORMAL HIGH (ref 0–99)
Triglycerides: 70 mg/dL (ref 0–149)
VLDL Cholesterol Cal: 14 mg/dL (ref 5–40)

## 2017-06-20 LAB — CMP14+EGFR
ALT: 11 IU/L (ref 0–32)
AST: 16 IU/L (ref 0–40)
Albumin/Globulin Ratio: 1.7 (ref 1.2–2.2)
Albumin: 5.2 g/dL (ref 3.5–5.5)
Alkaline Phosphatase: 56 IU/L (ref 39–117)
BUN/Creatinine Ratio: 13 (ref 9–23)
BUN: 12 mg/dL (ref 6–20)
Bilirubin Total: 0.3 mg/dL (ref 0.0–1.2)
CALCIUM: 9.9 mg/dL (ref 8.7–10.2)
CO2: 24 mmol/L (ref 20–29)
Chloride: 104 mmol/L (ref 96–106)
Creatinine, Ser: 0.91 mg/dL (ref 0.57–1.00)
GFR, EST AFRICAN AMERICAN: 101 mL/min/{1.73_m2} (ref >59)
GFR, EST NON AFRICAN AMERICAN: 87 mL/min/{1.73_m2} (ref >59)
GLUCOSE: 82 mg/dL (ref 65–99)
Globulin, Total: 3 g/dL (ref 1.5–4.5)
Potassium: 4.2 mmol/L (ref 3.5–5.2)
Sodium: 143 mmol/L (ref 134–144)
TOTAL PROTEIN: 8.2 g/dL (ref 6.0–8.5)

## 2017-06-20 LAB — CBC WITH DIFFERENTIAL/PLATELET
BASOS: 0 %
Basophils Absolute: 0 10*3/uL (ref 0.0–0.2)
EOS (ABSOLUTE): 0 10*3/uL (ref 0.0–0.4)
Eos: 1 %
HEMATOCRIT: 43.5 % (ref 34.0–46.6)
Hemoglobin: 14.5 g/dL (ref 11.1–15.9)
IMMATURE GRANULOCYTES: 0 %
Immature Grans (Abs): 0 10*3/uL (ref 0.0–0.1)
LYMPHS ABS: 1.4 10*3/uL (ref 0.7–3.1)
LYMPHS: 37 %
MCH: 26.4 pg — ABNORMAL LOW (ref 26.6–33.0)
MCHC: 33.3 g/dL (ref 31.5–35.7)
MCV: 79 fL (ref 79–97)
MONOCYTES: 11 %
MONOS ABS: 0.4 10*3/uL (ref 0.1–0.9)
NEUTROS PCT: 51 %
Neutrophils Absolute: 1.8 10*3/uL (ref 1.4–7.0)
Platelets: 215 10*3/uL (ref 150–379)
RBC: 5.49 x10E6/uL — ABNORMAL HIGH (ref 3.77–5.28)
RDW: 15.2 % (ref 12.3–15.4)
WBC: 3.7 10*3/uL (ref 3.4–10.8)

## 2017-06-20 LAB — HIV ANTIBODY (ROUTINE TESTING W REFLEX): HIV Screen 4th Generation wRfx: NONREACTIVE

## 2017-06-23 LAB — GONOCOCCUS CULTURE

## 2017-06-23 LAB — PAP IG, CT-NG, RFX HPV ASCU
CHLAMYDIA, NUC. ACID AMP: NEGATIVE
Gonococcus by Nucleic Acid Amp: NEGATIVE
PAP Smear Comment: 0

## 2017-06-23 LAB — CHLAMYDIA CULTURE: Chlamydia Culture: NEGATIVE

## 2017-06-23 MED ORDER — FLUCONAZOLE 150 MG PO TABS: 150.0000 mg | ORAL_TABLET | Freq: Once | ORAL | 0 refills | Status: AC

## 2017-06-23 NOTE — Addendum Note (Signed)
Addended by: Morrell Riddle on: 06/23/2017 04:29 PM   Modules accepted: Orders

## 2017-06-30 ENCOUNTER — Encounter: Payer: Self-pay | Admitting: Physician Assistant

## 2017-06-30 DIAGNOSIS — K30 Functional dyspepsia: Secondary | ICD-10-CM

## 2017-06-30 MED ORDER — FLUCONAZOLE 150 MG PO TABS: 150.0000 mg | ORAL_TABLET | Freq: Once | ORAL | 0 refills | Status: AC

## 2017-06-30 MED ORDER — RANITIDINE HCL 150 MG PO TABS: 150.0000 mg | ORAL_TABLET | Freq: Two times a day (BID) | ORAL | 2 refills | Status: DC

## 2017-09-18 ENCOUNTER — Encounter: Payer: Self-pay | Admitting: Family Medicine

## 2017-09-18 ENCOUNTER — Ambulatory Visit: Payer: BC Managed Care – PPO | Admitting: Family Medicine

## 2017-09-18 ENCOUNTER — Other Ambulatory Visit: Payer: Self-pay

## 2017-09-18 VITALS — BP 112/78 | HR 92 | Temp 98.0°F | Resp 16 | Ht 65.75 in | Wt 128.0 lb

## 2017-09-18 DIAGNOSIS — L02439 Carbuncle of limb, unspecified: Secondary | ICD-10-CM | POA: Diagnosis not present

## 2017-09-18 DIAGNOSIS — L02429 Furuncle of limb, unspecified: Secondary | ICD-10-CM

## 2017-09-18 DIAGNOSIS — F988 Other specified behavioral and emotional disorders with onset usually occurring in childhood and adolescence: Secondary | ICD-10-CM | POA: Diagnosis not present

## 2017-09-18 MED ORDER — AMPHETAMINE-DEXTROAMPHETAMINE 15 MG PO TABS: 15.0000 mg | ORAL_TABLET | Freq: Two times a day (BID) | ORAL | 0 refills | Status: DC

## 2017-09-18 MED ORDER — DOXYCYCLINE HYCLATE 100 MG PO TABS: 100.0000 mg | ORAL_TABLET | Freq: Two times a day (BID) | ORAL | 0 refills | Status: DC

## 2017-09-18 NOTE — Patient Instructions (Addendum)
     IF you received an x-ray today, you will receive an invoice from [facility]. Please contact [facility] at [phone] with questions or concerns regarding your invoice.   IF you received labwork today, you will receive an invoice from [facility]. Please contact [facility] at [phone] with questions or concerns regarding your invoice.   Our billing staff will not be able to assist you with questions regarding bills from these companies.  You will be contacted with the lab results as soon as they are available. The fastest way to get your results is to activate your My Chart account. Instructions are located on the last page of this paperwork. If you have not heard from us regarding the results in 2 weeks, please contact this office.     Skin Abscess A skin abscess is an infected area on or under your skin that contains pus and other material. An abscess can happen almost anywhere on your body. Some abscesses break open (rupture) on their own. Most continue to get worse unless they are treated. The infection can spread deeper into the body and into your blood, which can make you feel sick. Treatment usually involves draining the abscess. Follow these instructions at home: Abscess Care  If you have an abscess that has not drained, place a warm, clean, wet washcloth over the abscess several times a day. Do this as told by your doctor.  Follow instructions from your doctor about how to take care of your abscess. Make sure you: ? Cover the abscess with a bandage (dressing). ? Change your bandage or gauze as told by your doctor. ? Wash your hands with soap and water before you change the bandage or gauze. If you cannot use soap and water, use hand sanitizer.  Check your abscess every day for signs that the infection is getting worse. Check for: ? More redness, swelling, or pain. ? More fluid or blood. ? Warmth. ? More pus or a bad smell. Medicines   Take  over-the-counter and prescription medicines only as told by your doctor.  If you were prescribed an antibiotic medicine, take it as told by your doctor. Do not stop taking the antibiotic even if you start to feel better. General instructions  To avoid spreading the infection: ? Do not share personal care items, towels, or hot tubs with others. ? Avoid making skin-to-skin contact with other people.  Keep all follow-up visits as told by your doctor. This is important. Contact a doctor if:  You have more redness, swelling, or pain around your abscess.  You have more fluid or blood coming from your abscess.  Your abscess feels warm when you touch it.  You have more pus or a bad smell coming from your abscess.  You have a fever.  Your muscles ache.  You have chills.  You feel sick. Get help right away if:  You have very bad (severe) pain.  You see red streaks on your skin spreading away from the abscess. This information is not intended to replace advice given to you by your health care provider. Make sure you discuss any questions you have with your health care provider. Document Released: 02/18/2008 Document Revised: 04/27/2016 Document Reviewed: 07/11/2015 Elsevier Interactive Patient Education  2018 Elsevier Inc.  

## 2017-09-18 NOTE — Progress Notes (Signed)
1/4/20198:22 AM  Maureen Cantrell 01/23/1991, 27 y.o. female [id number]  Chief Complaint  Patient presents with  . ADD    follow-up with refills   . Cyst    pt states she has 3 cyst under her left arm x 4 days     HPI:   Patient is a 27 y.o. female with past medical history significant for ADD who presents today for refill of adderrall and boils in left axilla.  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 Told to call in 3 months for refills with follow-up in 6 months. Patient due for refill today. Wondering about change in dose, as feels current dose not as effective.  Also had a large boil in left axilla a week ago, drained spontaneously, went down but did not completely resolve, however not hurting anymore. Then she noticed new boils about 4 days ago, not painful. No drainage. Has been doing boil-ez and warm compresses.   [facility] 09/18/2017 06/19/2017 11/29/2016  Decreased Interest 0 0 0  Down, Depressed, Hopeless 0 0 0  PHQ - 2 Score 0 0 0    No Known Allergies  Prior to Admission medications   Medication Sig Start Date End Date Taking? Authorizing Provider  amphetamine-dextroamphetamine (ADDERALL) 15 MG tablet Take 1 tablet by mouth 2 (two) times daily. 06/19/17  Yes Weber, Sarah L, PA-C  amphetamine-dextroamphetamine (ADDERALL) 15 MG tablet Take 1 tablet by mouth 2 (two) times daily. 06/19/17  Yes Weber, Sarah L, PA-C  amphetamine-dextroamphetamine (ADDERALL) 15 MG tablet Take 1 tablet by mouth 2 (two) times daily. 06/19/17  Yes Weber, Sarah L, PA-C  ranitidine (ZANTAC) 150 MG tablet Take 1 tablet (150 mg total) by mouth 2 (two) times daily. 06/30/17  Yes Weber, Sharene SkeansSarah L, PA-C    Past Medical History:  Diagnosis Date  . ADD (attention deficit disorder)   . Allergy   . GERD (gastroesophageal reflux disease)     History reviewed. No pertinent surgical history.  Social History   Tobacco Use    . Smoking status: Former Smoker    Years: 3.00    Types: Cigarettes    Last attempt to quit: 02/11/2016    Years since quitting: 1.6  . Smokeless tobacco: Never Used  Substance Use Topics  . Alcohol use: Yes    Alcohol/week: 0.0 oz    Comment: beer 1-2.wk    Family History  Problem Relation Age of Onset  . Diabetes Maternal Grandfather   . Colon cancer Maternal Grandmother        75-80 ?    ROS Per hpi  OBJECTIVE:  Blood pressure 112/78, pulse 92, temperature 98 F (36.7 C), temperature source Oral, resp. rate 16, height 5' 5.75" (1.67 m), weight 128 lb (58.1 kg), last menstrual period 09/06/2017, SpO2 95 %.  Wt Readings from Last 3 Encounters:  09/18/17 128 lb (58.1 kg)  06/19/17 118 lb 12.8 oz (53.9 kg)  11/29/16 120 lb 12.8 oz (54.8 kg)    Physical Exam  Constitutional: She is oriented to person, place, and time and well-developed, well-nourished, and in no distress.  HENT:  Head: Normocephalic and atraumatic.  Mouth/Throat: Mucous membranes are normal.  Eyes: EOM are normal. Pupils are equal, round, and reactive to light. No scleral icterus.  Neck: Neck supple.  Pulmonary/Chest: Effort normal.  Neurological: She is alert and oriented to person, place, and time. Gait normal.  Skin:  Skin is warm and dry.  Left axilla with a 1 cm indurated non fluctuant boil with 3 minute pustules around it. On the upper anterior edge an area of non-discrete fullness noted without warmth or erythema.  Psychiatric: Mood and affect normal.  Nursing note and vitals reviewed.    ASSESSMENT and PLAN  1. Boil, axilla Discussed supportive measures, new meds r/se/b. Patient educational handout given. Followup in 1 week to assess need for further management - doxycycline (VIBRA-TABS) 100 MG tablet; Take 1 tablet (100 mg total) by mouth 2 (two) times daily.  2. Attention deficit disorder (ADD) without hyperactivity Medication refilled at current dose, patient to return in 4 weeks to  further discuss concerns with PCP - amphetamine-dextroamphetamine (ADDERALL) 15 MG tablet; Take 1 tablet by mouth 2 (two) times daily.   Return for 1 week for boil, 4 weeks with PCP.    Myles Lipps, MD Primary Care at [facility] [address] [place], [place] [address] Ph.  [phone] Fax [phone]

## 2017-09-21 ENCOUNTER — Encounter: Payer: Self-pay | Admitting: Physician Assistant

## 2017-09-21 ENCOUNTER — Other Ambulatory Visit: Payer: Self-pay

## 2017-09-21 ENCOUNTER — Ambulatory Visit: Payer: BC Managed Care – PPO | Admitting: Physician Assistant

## 2017-09-21 VITALS — BP 108/64 | HR 71 | Temp 98.9°F | Resp 18 | Ht 65.55 in | Wt 130.2 lb

## 2017-09-21 DIAGNOSIS — L02419 Cutaneous abscess of limb, unspecified: Secondary | ICD-10-CM | POA: Diagnosis not present

## 2017-09-21 NOTE — Progress Notes (Signed)
Maureen FishermanBreonna R Swiech  MRN: [id number] DOB: 07/18/1991  Subjective:  Maureen Cantrell is a 27 y.o. female seen in office today for a chief complaint of boil under left armpit times 5 days.  Patient initially seen on 09/18/17 by Dr. Leretha PolSantiago.  Per review of the office note, patient had 1 cm indurated nonfluctuant boil in left axilla without warmth or erythema.  She was treated supportively with doxycycline and warm compresses.  Today, patient returns with worsening symptoms.  Notes that the boil has increased in size.  She is having lots of discomfort and pain.  Has associated redness and warmth.  Denies purulent drainage, fever, chills, diaphoresis, nausea, and vomiting.  Has tried warm compresses at least 3 times a day.  Has taken antibiotics as prescribed.  Does note that she shaves occasionally.  She has had these in the past and they have popped on their own. Former smoker, quit in 2017.   Review of Systems  Per HPI  Patient Active Problem List   Diagnosis Date Noted  . Esophageal reflux 10/02/2015  . ADD (attention deficit disorder) 11/25/2011    Current Outpatient Medications on File Prior to Visit  Medication Sig Dispense Refill  . amphetamine-dextroamphetamine (ADDERALL) 15 MG tablet Take 1 tablet by mouth 2 (two) times daily. 60 tablet 0  . amphetamine-dextroamphetamine (ADDERALL) 15 MG tablet Take 1 tablet by mouth 2 (two) times daily. 60 tablet 0  . amphetamine-dextroamphetamine (ADDERALL) 15 MG tablet Take 1 tablet by mouth 2 (two) times daily. 60 tablet 0  . doxycycline (VIBRA-TABS) 100 MG tablet Take 1 tablet (100 mg total) by mouth 2 (two) times daily. 20 tablet 0  . ranitidine (ZANTAC) 150 MG tablet Take 1 tablet (150 mg total) by mouth 2 (two) times daily. 60 tablet 2   No current facility-administered medications on file prior to visit.     No Known Allergies   Objective:  BP 108/64 (BP Location: Left Arm, Patient Position: Sitting, Cuff Size: Normal)   Pulse 71    Temp 98.9 F (37.2 C) (Oral)   Resp 18   Ht 5' 5.55" (1.665 m)   Wt 130 lb 3.2 oz (59.1 kg)   LMP 09/06/2017 (Approximate)   SpO2 99%   BMI 21.30 kg/m   Physical Exam  Constitutional: She is oriented to person, place, and time.  Well-developed, well-nourished female, she appears in pain lifting her arm above her head.  HENT:  Head: Normocephalic and atraumatic.  Eyes: Conjunctivae are normal.  Neck: Normal range of motion.  Pulmonary/Chest: Effort normal.  Neurological: She is alert and oriented to person, place, and time. Gait normal.  Skin: Skin is warm and dry.     Psychiatric: Affect normal.  Vitals reviewed.    PROCEDURE NOTE: I&D of Abscess Verbal consent obtained. Local anesthesia with 4cc of 2% lidocaine 1% with epinephrine. Site cleansed with Betadine x 2.  Incision of 0.5cm was made using a 11 blade, discharge of copious amounts of pus and serosanguinous fluid. Wound cavity was explored with curved hemostats and packed with 0.2" plain packing. Cleansed and dressed. Patient tolerated well. After care instructions provided.   Patient to return to clinic on 2-3 days for reevaluation/repacking.  Patient reports immediate relief after incision and drainage.   Assessment and Plan :  1. Abscess, axilla I&D performed.  Aftercare instructions given.  Return in 2-3 days for reevaluation. Recommended to continue with current antibiotics until complete.  Continue applying warm compress to affected area 4-5  times a day for 20 minutes at a time. - WOUND CULTURE   Benjiman Core PA-C  Primary Care at [facility] 09/21/2017 11:00 AM

## 2017-09-21 NOTE — Patient Instructions (Addendum)
Take the antibiotic as prescribed. Apply a warm compress to the area for 15-20 minutes 2-4 times each day. Use Ibuprofen 600-800 mg orally every 6-8 hours as needed for pain as needed. Change the dressing if it becomes saturated, leaks or falls off. If symptoms worsen or do not improve, please come back. Return on Thursday for follow up.    Incision and Drainage, Care After Refer to this sheet in the next few weeks. These instructions provide you with information about caring for yourself after your procedure. Your health care provider may also give you more specific instructions. Your treatment has been planned according to current medical practices, but problems sometimes occur. Call your health care provider if you have any problems or questions after your procedure. What can I expect after the procedure? After the procedure, it is common to have:  Pain or discomfort around your incision site.  Drainage from your incision.  Follow these instructions at home:  Take over-the-counter and prescription medicines only as told by your health care provider.  If you were prescribed an antibiotic medicine, take it as told by your health care provider.Do not stop taking the antibiotic even if you start to feel better.  Followinstructions from your health care provider about: ? How to take care of your incision. ? When and how you should change your packing and bandage (dressing). Wash your hands with soap and water before you change your dressing. If soap and water are not available, use hand sanitizer. ? When you should remove your dressing.  Do not take baths, swim, or use a hot tub until your health care provider approves.  Keep all follow-up visits as told by your health care provider. This is important.  Check your incision area every day for signs of infection. Check for: ? More redness, swelling, or pain. ? More fluid or blood. ? Warmth. ? Pus or a bad smell. Contact a health care  provider if:  Your cyst or abscess returns.  You have a fever.  You have more redness, swelling, or pain around your incision.  You have more fluid or blood coming from your incision.  Your incision feels warm to the touch.  You have pus or a bad smell coming from your incision. Get help right away if:  You have severe pain or bleeding.  You cannot eat or drink without vomiting.  You have decreased urine output.  You become short of breath.  You have chest pain.  You cough up blood.  The area where the incision and drainage occurred becomes numb or it tingles. This information is not intended to replace advice given to you by your health care provider. Make sure you discuss any questions you have with your health care provider. Document Released: 11/24/2011 Document Revised: 02/01/2016 Document Reviewed: 06/22/2015 Elsevier Interactive Patient Education  2018 [employer]    IF you received an x-ray today, you will receive an invoice [facility]. Please contact [facility] at [phone] with questions or concerns regarding your invoice.   IF you received labwork today, you will receive an invoice from [facility]. Please contact [facility] at [phone] with questions or concerns regarding your invoice.   Our billing staff will not be able to assist you with questions regarding bills from these companies.  You will be contacted with the lab results as soon as they are available. The fastest way to get your results is to activate your My Chart account. Instructions are located on the last  page of this paperwork. If you have not heard from [place] regarding the results in 2 weeks, please contact this office.

## 2017-09-24 ENCOUNTER — Ambulatory Visit: Payer: BC Managed Care – PPO | Admitting: Physician Assistant

## 2017-09-24 ENCOUNTER — Other Ambulatory Visit: Payer: Self-pay

## 2017-09-24 ENCOUNTER — Encounter: Payer: Self-pay | Admitting: Physician Assistant

## 2017-09-24 ENCOUNTER — Other Ambulatory Visit: Payer: Self-pay | Admitting: Physician Assistant

## 2017-09-24 VITALS — BP 102/60 | HR 85 | Temp 99.1°F | Resp 18 | Ht 65.55 in | Wt 128.2 lb

## 2017-09-24 DIAGNOSIS — L02419 Cutaneous abscess of limb, unspecified: Secondary | ICD-10-CM

## 2017-09-24 DIAGNOSIS — B379 Candidiasis, unspecified: Secondary | ICD-10-CM

## 2017-09-24 DIAGNOSIS — T3695XA Adverse effect of unspecified systemic antibiotic, initial encounter: Secondary | ICD-10-CM

## 2017-09-24 LAB — WOUND CULTURE

## 2017-09-24 MED ORDER — CLINDAMYCIN HCL 300 MG PO CAPS: 300.0000 mg | ORAL_CAPSULE | Freq: Three times a day (TID) | ORAL | 0 refills | Status: AC

## 2017-09-24 MED ORDER — FLUCONAZOLE 150 MG PO TABS: 150.0000 mg | ORAL_TABLET | Freq: Once | ORAL | 0 refills | Status: AC

## 2017-09-24 NOTE — Progress Notes (Signed)
Meds ordered this encounter  Medications  . clindamycin (CLEOCIN) 300 MG capsule    Sig: Take 1 capsule (300 mg total) by mouth 3 (three) times daily for 7 days.    Dispense:  21 capsule    Refill:  0    Order Specific Question:   Supervising Provider    Answer:   Ethelda ChickSMITH, KRISTI M [[id number]]

## 2017-09-24 NOTE — Progress Notes (Signed)
Maureen Cantrell  MRN: [id number] DOB: 11-11-1990  Subjective:  Maureen Cantrell is a 27 y.o. female seen in office today for a chief complaint of follow-up on abscess I&D.  Patient seen on 09/21/17 for abscess in left axilla.  I&D performed.  Will culture obtained.  Patient had already been placed on Doxy a few days prior to this.  Today, she reports she is doing much better.  Notes her pain was a 10 out of 10 and now it is a 2 out of 10.  She has taken her antibiotics as prescribed and applied warm compresses as instructed.  Notes that the packing fell out last night.  She is continue to change her dressings as instructed.  Notes there is still some swelling around the area but denies redness and warmth.  Denies fever, chills, nausea, vomiting.  She does report that since she has been on antibiotics she has noted some vaginal itching and white discharge.  She has a history of yeast infections associated with antibiotic use.  Has no other questions or concerns.  Review of Systems  Genitourinary: Negative for dysuria, frequency, hematuria and urgency.    Per HPI  Patient Active Problem List   Diagnosis Date Noted  . Esophageal reflux 10/02/2015  . ADD (attention deficit disorder) 11/25/2011    Current Outpatient Medications on File Prior to Visit  Medication Sig Dispense Refill  . amphetamine-dextroamphetamine (ADDERALL) 15 MG tablet Take 1 tablet by mouth 2 (two) times daily. 60 tablet 0  . amphetamine-dextroamphetamine (ADDERALL) 15 MG tablet Take 1 tablet by mouth 2 (two) times daily. 60 tablet 0  . amphetamine-dextroamphetamine (ADDERALL) 15 MG tablet Take 1 tablet by mouth 2 (two) times daily. 60 tablet 0  . doxycycline (VIBRA-TABS) 100 MG tablet Take 1 tablet (100 mg total) by mouth 2 (two) times daily. 20 tablet 0  . ranitidine (ZANTAC) 150 MG tablet Take 1 tablet (150 mg total) by mouth 2 (two) times daily. 60 tablet 2   No current facility-administered medications on file  prior to visit.     No Known Allergies   Objective:  BP 102/60   Pulse 85   Temp 99.1 F (37.3 C) (Oral)   Resp 18   Ht 5' 5.55" (1.665 m)   Wt 128 lb 3.2 oz (58.2 kg)   LMP 09/06/2017 (Approximate)   SpO2 98%   BMI 20.98 kg/m   Physical Exam  Constitutional: She is oriented to person, place, and time and well-developed, well-nourished, and in no distress.  HENT:  Head: Normocephalic and atraumatic.  Eyes: Conjunctivae are normal.  Neck: Normal range of motion.  Pulmonary/Chest: Effort normal.  Neurological: She is alert and oriented to person, place, and time. Gait normal.  Skin: Skin is warm and dry.     Psychiatric: Affect normal.  Vitals reviewed.  Wound care: Affected area cleansed with alcohol pad.  Wound cavity was cleansed with 5 cc of 1% lidocaine without epinephrine.  Lightly repacked with quarter inch packing.  Dressing applied.  Assessment and Plan :  1. Abscess, axilla Well-healing s/p I&D. Pt is well appearing and in no distress. Wound cavity was lightly repacked.  Wound culture has not returned yet.  Instructed to continue doxycycline as prescribed.  Continue applying warm compresses to affected area.  Return in 2 days for reevaluation. 2. Antibiotic-induced yeast infection - fluconazole (DIFLUCAN) 150 MG tablet; Take 1 tablet (150 mg total) by mouth once for 1 dose. Repeat if needed  Dispense:  2 tablet; Refill: 0  Benjiman CoreBrittany Traeh Milroy PA-C  Primary Care at [facility] 09/24/2017 9:09 AM

## 2017-09-24 NOTE — Patient Instructions (Addendum)
  Follow up in 2 days for packing evaluation. In the meantime, continue changing the dressing when it becomes soaked and using warm compresses. Complete antibiotic as prescribed.  For yeast, take one diflucan now and then one again if your symptoms persist after you are finished with doxycycline.    IF you received an x-ray today, you will receive an invoice from [facility]. Please contact [facility] at [phone] with questions or concerns regarding your invoice.   IF you received labwork today, you will receive an invoice from [facility]. Please contact [facility] at [phone] with questions or concerns regarding your invoice.   Our billing staff will not be able to assist you with questions regarding bills from these companies.  You will be contacted with the lab results as soon as they are available. The fastest way to get your results is to activate your My Chart account. Instructions are located on the last page of this paperwork. If you have not heard from us regarding the results in 2 weeks, please contact this office.

## 2017-09-25 ENCOUNTER — Ambulatory Visit: Payer: BC Managed Care – PPO | Admitting: Family Medicine

## 2017-09-25 NOTE — Progress Notes (Signed)
Spoke with pt, understands that she needs to stop taking the Doxy and start the new meds that were sent to the pharmacy.

## 2017-09-26 ENCOUNTER — Encounter: Payer: Self-pay | Admitting: Urgent Care

## 2017-09-26 ENCOUNTER — Ambulatory Visit (INDEPENDENT_AMBULATORY_CARE_PROVIDER_SITE_OTHER): Payer: BC Managed Care – PPO | Admitting: Urgent Care

## 2017-09-26 ENCOUNTER — Other Ambulatory Visit: Payer: Self-pay

## 2017-09-26 VITALS — BP 100/60 | HR 74 | Temp 97.7°F | Resp 18 | Ht 65.5 in | Wt 130.0 lb

## 2017-09-26 DIAGNOSIS — L02419 Cutaneous abscess of limb, unspecified: Secondary | ICD-10-CM

## 2017-09-26 DIAGNOSIS — Z5189 Encounter for other specified aftercare: Secondary | ICD-10-CM

## 2017-09-26 NOTE — Patient Instructions (Addendum)
Continue to change dressing daily after you shower. Check back with your previous provider for follow up on your wound.   Wound Care, Adult Taking care of your wound properly can help to prevent pain and infection. It can also help your wound to heal more quickly. How is this treated? Wound care  Follow instructions from your health care provider about how to take care of your wound. Make sure you: ? Wash your hands with soap and water before you change the bandage (dressing). If soap and water are not available, use hand sanitizer. ? Change your dressing as told by your health care provider. ? Leave stitches (sutures), skin glue, or adhesive strips in place. These skin closures may need to stay in place for 2 weeks or longer. If adhesive strip edges start to loosen and curl up, you may trim the loose edges. Do not remove adhesive strips completely unless your health care provider tells you to do that.  Check your wound area every day for signs of infection. Check for: ? More redness, swelling, or pain. ? More fluid or blood. ? Warmth. ? Pus or a bad smell.  Ask your health care provider if you should clean the wound with mild soap and water. Doing this may include: ? Using a clean towel to pat the wound dry after cleaning it. Do not rub or scrub the wound. ? Applying a cream or ointment. Do this only as told by your health care provider. ? Covering the incision with a clean dressing.  Ask your health care provider when you can leave the wound uncovered. Medicines   If you were prescribed an antibiotic medicine, cream, or ointment, take or use the antibiotic as told by your health care provider. Do not stop taking or using the antibiotic even if your condition improves.  Take over-the-counter and prescription medicines only as told by your health care provider. If you were prescribed pain medicine, take it at least 30 minutes before doing any wound care or as told by your health care  provider. General instructions  Return to your normal activities as told by your health care provider. Ask your health care provider what activities are safe.  Do not scratch or pick at the wound.  Keep all follow-up visits as told by your health care provider. This is important.  Eat a diet that includes protein, vitamin A, vitamin C, and other nutrient-rich foods. These help the wound heal: ? Protein-rich foods include meat, dairy, beans, nuts, and other sources. ? Vitamin A-rich foods include carrots and dark green, leafy vegetables. ? Vitamin C-rich foods include citrus, tomatoes, and other fruits and vegetables. ? Nutrient-rich foods have protein, carbohydrates, fat, vitamins, or minerals. Eat a variety of healthy foods including vegetables, fruits, and whole grains. Contact a health care provider if:  You received a tetanus shot and you have swelling, severe pain, redness, or bleeding at the injection site.  Your pain is not controlled with medicine.  You have more redness, swelling, or pain around the wound.  You have more fluid or blood coming from the wound.  Your wound feels warm to the touch.  You have pus or a bad smell coming from the wound.  You have a fever or chills.  You are nauseous or you vomit.  You are dizzy. Get help right away if:  You have a red streak going away from your wound.  The edges of the wound open up and separate.  Your wound is  bleeding and the bleeding does not stop with gentle pressure.  You have a rash.  You faint.  You have trouble breathing. This information is not intended to replace advice given to you by your health care provider. Make sure you discuss any questions you have with your health care provider. Document Released: 06/10/2008 Document Revised: 04/30/2016 Document Reviewed: 03/18/2016 Elsevier Interactive Patient Education  2017 [employer]     IF you received an x-ray today, you will receive an invoice from  [facility]. Please contact [facility] at [phone] with questions or concerns regarding your invoice.   IF you received labwork today, you will receive an invoice from [facility]. Please contact [facility] at [phone] with questions or concerns regarding your invoice.   Our billing staff will not be able to assist you with questions regarding bills from these companies.  You will be contacted with the lab results as soon as they are available. The fastest way to get your results is to activate your My Chart account. Instructions are located on the last page of this paperwork. If you have not heard from us regarding the results in 2 weeks, please contact this office.

## 2017-09-26 NOTE — Progress Notes (Signed)
    MRN: [id number] DOB: 10/04/1990  Subjective:   [name] is a 27 y.o. female presenting for follow up on axillary abscess. Last OV for same was 09/24/2017, was called yesterday and advised to switch from doxycycline to clindamycin due to culture results. Today, reports that she no longer has the packing. Reports improvement in her pain but still hurts to raise her arm. Denies fever, spontaneous drainage of pus or bleeding, redness.   Maureen BillBreonna has a current medication list which includes the following prescription(s): amphetamine-dextroamphetamine, amphetamine-dextroamphetamine, amphetamine-dextroamphetamine, clindamycin, ranitidine, and doxycycline. Also has No Known Allergies.  Maureen BillBreonna  has a past medical history of ADD (attention deficit disorder), Allergy, and GERD (gastroesophageal reflux disease). Also  has no past surgical history on file.  Objective:   Vitals: BP 100/60 (BP Location: Left Arm, Patient Position: Sitting, Cuff Size: Normal)   Pulse 74   Temp 97.7 F (36.5 C) (Oral)   Resp 18   Ht 5' 5.5" (1.664 m)   Wt 130 lb (59 kg)   LMP 09/06/2017 (Approximate)   SpO2 100%   BMI 21.30 kg/m   Physical Exam  Constitutional: She is oriented to person, place, and time. She appears well-developed and well-nourished.  Cardiovascular: Normal rate.  Pulmonary/Chest: Effort normal.  Neurological: She is alert and oriented to person, place, and time.  Skin:       Assessment and Plan :   Encounter for wound care  Abscess, axilla  Wound care reviewed, continue clindamycin and follow up with previous provider. Return-to-clinic precautions discussed, patient verbalized understanding.   Wallis BambergMario Shaydon Lease, PA-C [facility] [phone] 09/26/2017 2:34 PM

## 2017-10-05 ENCOUNTER — Ambulatory Visit: Payer: BC Managed Care – PPO | Admitting: Physician Assistant

## 2017-10-28 ENCOUNTER — Ambulatory Visit (INDEPENDENT_AMBULATORY_CARE_PROVIDER_SITE_OTHER): Payer: BC Managed Care – PPO

## 2017-10-28 ENCOUNTER — Ambulatory Visit (INDEPENDENT_AMBULATORY_CARE_PROVIDER_SITE_OTHER): Payer: BC Managed Care – PPO | Admitting: Orthopedic Surgery

## 2017-10-28 ENCOUNTER — Encounter (INDEPENDENT_AMBULATORY_CARE_PROVIDER_SITE_OTHER): Payer: Self-pay | Admitting: Orthopedic Surgery

## 2017-10-28 DIAGNOSIS — M25572 Pain in left ankle and joints of left foot: Secondary | ICD-10-CM | POA: Diagnosis not present

## 2017-10-28 DIAGNOSIS — M79672 Pain in left foot: Secondary | ICD-10-CM | POA: Diagnosis not present

## 2017-10-28 DIAGNOSIS — G8929 Other chronic pain: Secondary | ICD-10-CM | POA: Diagnosis not present

## 2017-10-28 DIAGNOSIS — M25571 Pain in right ankle and joints of right foot: Secondary | ICD-10-CM | POA: Diagnosis not present

## 2017-10-28 DIAGNOSIS — M79671 Pain in right foot: Secondary | ICD-10-CM | POA: Diagnosis not present

## 2017-10-31 ENCOUNTER — Encounter (INDEPENDENT_AMBULATORY_CARE_PROVIDER_SITE_OTHER): Payer: Self-pay | Admitting: Orthopedic Surgery

## 2017-10-31 NOTE — Progress Notes (Signed)
Office Visit Note   Patient: Maureen Cantrell           Date of Birth: April 05, 1991           MRN: [id number] Visit Date: 10/28/2017 Requested by: Morrell Riddle, PA-C [address] [place], [place] [address] PCP: Larkin Ina  Subjective: Chief Complaint  Patient presents with  . Right Foot - Pain  . Left Foot - Pain    HPI: Maureen Cantrell is a patient with bilateral heel pain right worse than left.  She has had pain in the heels for months.  States that it is a chronic pain which is constant.  She has had one injection into both plantar fascia intolerance when she was playing basketball gave her good relief for the past 4-5 years.  She is tried ibuprofen without relief and orthotics without relief.              ROS: All systems reviewed are negative as they relate to the chief complaint within the history of present illness.  Patient denies  fevers or chills.   Assessment & Plan: Visit Diagnoses:  1. Chronic pain of both ankles   2. Heel pain, bilateral     Plan: Impression is normal exam and normal radiographs of both heels.  He does have plantar fascial tenderness and plantar heel pain when she walks.  Worse in the morning which is consistent with plantar fasciitis importantly this is the same type of symptoms she had before spine did well to an injection.  Plan at this time is for planned injection into that right heel and possibly left heel on Monday.  Follow-Up Instructions: No Follow-up on file.   Orders:  Orders Placed This Encounter  Procedures  . XR Ankle Complete Right  . XR Ankle Complete Left   No orders of the defined types were placed in this encounter.     Procedures: No procedures performed   Clinical Data: No additional findings.  Objective: Vital Signs: There were no vitals taken for this visit.  Physical Exam:   Constitutional: Patient appears well-developed HEENT:  Head: Normocephalic Eyes:EOM are normal Neck: Normal range of  motion Cardiovascular: Normal rate Pulmonary/chest: Effort normal Neurologic: Patient is alert Skin: Skin is warm Psychiatric: Patient has normal mood and affect    Ortho Exam: Orthopedic exam demonstrates normal gait and alignment.  She does have slight pes planus.  Does have appropriate heel inversion when she stands on her toes.  Does have tenderness to palpation on the plantar aspect of the heel but negative squeeze test on the calcaneus.  Affecting both ankles.  Pedal pulses palpable.  Bilaterally the patient has palpable intact nontender anterior to posterior to peroneal and Achilles tendons.  Heel cords not excessively tight.  Specialty Comments:  No specialty comments available.  Imaging: No results found.   PMFS History: Patient Active Problem List   Diagnosis Date Noted  . Esophageal reflux 10/02/2015  . ADD (attention deficit disorder) 11/25/2011   Past Medical History:  Diagnosis Date  . ADD (attention deficit disorder)   . Allergy   . GERD (gastroesophageal reflux disease)     Family History  Problem Relation Age of Onset  . Diabetes Maternal Grandfather   . Colon cancer Maternal Grandmother        75-80 ?    History reviewed. No pertinent surgical history. Social History   Occupational History    Comment: [employer]  . Occupation: Programmer, applications  Comment: [employer]  . Occupation: CuratorHabilitation Technician  Tobacco Use  . Smoking status: Former Smoker    Years: 3.00    Types: Cigarettes    Last attempt to quit: 02/11/2016    Years since quitting: 1.7  . Smokeless tobacco: Never Used  Substance and Sexual Activity  . Alcohol use: Yes    Alcohol/week: 0.0 oz    Comment: beer 1-2.wk  . Drug use: No  . Sexual activity: Yes    Partners: Female    Birth control/protection: Abstinence

## 2017-11-02 ENCOUNTER — Ambulatory Visit (INDEPENDENT_AMBULATORY_CARE_PROVIDER_SITE_OTHER): Payer: BC Managed Care – PPO | Admitting: Orthopedic Surgery

## 2017-11-02 ENCOUNTER — Encounter (INDEPENDENT_AMBULATORY_CARE_PROVIDER_SITE_OTHER): Payer: Self-pay | Admitting: Orthopedic Surgery

## 2017-11-02 DIAGNOSIS — M722 Plantar fascial fibromatosis: Secondary | ICD-10-CM

## 2017-11-04 ENCOUNTER — Encounter (INDEPENDENT_AMBULATORY_CARE_PROVIDER_SITE_OTHER): Payer: Self-pay | Admitting: Orthopedic Surgery

## 2017-11-04 DIAGNOSIS — M722 Plantar fascial fibromatosis: Secondary | ICD-10-CM

## 2017-11-04 MED ORDER — BUPIVACAINE HCL 0.5 % IJ SOLN
1.0000 mL | INTRAMUSCULAR | Status: AC | PRN
  Administered 2017-11-04: 1 mL

## 2017-11-04 MED ORDER — METHYLPREDNISOLONE ACETATE 40 MG/ML IJ SUSP
13.3300 mg | INTRAMUSCULAR | Status: AC | PRN
  Administered 2017-11-04: 13.33 mg

## 2017-11-04 MED ORDER — LIDOCAINE HCL 1 % IJ SOLN
3.0000 mL | INTRAMUSCULAR | Status: AC | PRN
  Administered 2017-11-04: 3 mL

## 2017-11-04 NOTE — Progress Notes (Signed)
   Procedure Note  Patient: Claudie FishermanBreonna R Medaglia             Date of Birth: 02/14/1991           MRN: [id number]             Visit Date: 11/02/2017  Procedures: Visit Diagnoses: Plantar fasciitis, bilateral  Foot Inj Date/Time: 11/04/2017 10:06 PM Performed by: Cammy Copaean, Gregory Scott, MD Authorized by: Cammy Copaean, Gregory Scott, MD   Consent Given by:  Patient Site marked: the procedure site was marked   Timeout: prior to procedure the correct patient, procedure, and site was verified   Indications:  Fasciitis and pain Condition: Plantar Fasciitis   Location: right plantar fascia muscle   Prep: patient was prepped and draped in usual sterile fashion   Needle Size:  22 G Medications:  3 mL lidocaine 1 %; 1 mL bupivacaine 0.5 %; 13.33 mg methylPREDNISolone acetate 40 MG/ML Patient Tolerance:  Patient tolerated the procedure well with no immediate complications

## 2017-11-09 ENCOUNTER — Telehealth: Payer: Self-pay | Admitting: Physician Assistant

## 2017-11-09 DIAGNOSIS — F988 Other specified behavioral and emotional disorders with onset usually occurring in childhood and adolescence: Secondary | ICD-10-CM

## 2017-11-09 NOTE — Telephone Encounter (Signed)
Copied from CRM #[id number]. Topic: Quick Communication - See Telephone Encounter >> Nov 09, 2017  9:[id number] AM Trula SladeWalter, Linda F wrote: CRM for notification. See Telephone encounter for:  11/09/17. Patient would like a refill on her amphetamine-dextroamphetamine (ADDERALL) 15 MG tablet medication.  Please [facility] [address]

## 2017-11-10 MED ORDER — AMPHETAMINE-DEXTROAMPHETAMINE 15 MG PO TABS: 15.0000 mg | ORAL_TABLET | Freq: Two times a day (BID) | ORAL | 0 refills | Status: DC

## 2017-11-10 NOTE — Addendum Note (Signed)
Addended by: Morrell RiddleWEBER, SARAH L on: 11/10/2017 09:51 AM   Modules accepted: Orders

## 2017-11-10 NOTE — Telephone Encounter (Signed)
Please see note below. 

## 2017-11-24 ENCOUNTER — Telehealth (INDEPENDENT_AMBULATORY_CARE_PROVIDER_SITE_OTHER): Payer: Self-pay | Admitting: Orthopedic Surgery

## 2017-11-24 NOTE — Telephone Encounter (Signed)
FYI requesting injection in her left heel at appt made for 4/22. Did not state what type, wanted same as the right heel at last appt.

## 2017-12-07 ENCOUNTER — Other Ambulatory Visit: Payer: Self-pay

## 2017-12-07 DIAGNOSIS — F988 Other specified behavioral and emotional disorders with onset usually occurring in childhood and adolescence: Secondary | ICD-10-CM

## 2017-12-07 NOTE — Telephone Encounter (Signed)
Copied from CRM [id number]. Topic: General - Other >> Dec 07, 2017  9:38 AM Elliot GaultBell, Tiffany M wrote: Relation to pt: self Call back number: [phone] [id number] [place] - [address] [phone] (Phone) [phone] (Fax)  Reason for call:  Patient requesting amphetamine-dextroamphetamine (ADDERALL) 15 MG tablet, please advise >> Dec 07, 2017  9:39 AM Celedonio MiyamotoBell, Tiffany M wrote: Relation to pt: self Call back number: [phone] [id number] [place], [place] - [address] [phone] (Phone) [phone] (Fax)  Reason for call:  Patient requesting amphetamine-dextroamphetamine (ADDERALL) 15 MG tablet, please advise

## 2017-12-07 NOTE — Telephone Encounter (Signed)
LOV: 06/19/17  Maureen ArgueSarah Weber,PA  [facility] [address]

## 2017-12-10 NOTE — Telephone Encounter (Signed)
Patient calling to check on status of adderall refill.

## 2017-12-11 NOTE — Telephone Encounter (Signed)
Time for OV

## 2017-12-16 ENCOUNTER — Telehealth: Payer: Self-pay | Admitting: Physician Assistant

## 2017-12-16 NOTE — Telephone Encounter (Signed)
MYCHART MESSAGE SENT TO PT LETTING THEM KNOW THEIR APT FOR 4/6 HAD BEEN CANCELLED DUE TO PROVIDER BEING OUT OF TOWN WILL NEED TO RESCHEDULE ON HER NEXT WORKING DAY OR WITH A DIFFERENT PROVIDER. °

## 2017-12-19 ENCOUNTER — Ambulatory Visit: Payer: BC Managed Care – PPO | Admitting: Physician Assistant

## 2017-12-25 ENCOUNTER — Ambulatory Visit: Payer: BC Managed Care – PPO | Admitting: Physician Assistant

## 2017-12-25 ENCOUNTER — Encounter: Payer: Self-pay | Admitting: Physician Assistant

## 2017-12-25 ENCOUNTER — Other Ambulatory Visit: Payer: Self-pay

## 2017-12-25 DIAGNOSIS — F988 Other specified behavioral and emotional disorders with onset usually occurring in childhood and adolescence: Secondary | ICD-10-CM

## 2017-12-25 MED ORDER — AMPHETAMINE-DEXTROAMPHETAMINE 15 MG PO TABS: 15.0000 mg | ORAL_TABLET | Freq: Two times a day (BID) | ORAL | 0 refills | Status: DC

## 2017-12-25 NOTE — Patient Instructions (Signed)
     IF you received an x-ray today, you will receive an invoice from [facility]. Please contact [facility] at [phone] with questions or concerns regarding your invoice.   IF you received labwork today, you will receive an invoice from [facility]. Please contact [facility] at [phone] with questions or concerns regarding your invoice.   Our billing staff will not be able to assist you with questions regarding bills from these companies.  You will be contacted with the lab results as soon as they are available. The fastest way to get your results is to activate your My Chart account. Instructions are located on the last page of this paperwork. If you have not heard from us regarding the results in 2 weeks, please contact this office.     

## 2017-12-25 NOTE — Progress Notes (Signed)
   Maureen FishermanBreonna R Cantrell  MRN: [id number] DOB: 06/09/1991  PCP: Morrell RiddleWeber, Sarah L, PA-C  Chief Complaint  Patient presents with  . Medication Refill    adderall     Subjective:  Pt presents to clinic for medication recheck.  She is doing well on this medication.  She is able to do what she needs to do when she needs to do it.  She will sometimes take it on the wknd but not always - it depends on what she has to do.  History is obtained by patient.  Review of Systems  Constitutional: Negative for appetite change, chills, fever and unexpected weight change.  Cardiovascular: Negative for chest pain and palpitations.  Psychiatric/Behavioral: Negative for sleep disturbance.    Patient Active Problem List   Diagnosis Date Noted  . Esophageal reflux 10/02/2015  . ADD (attention deficit disorder) 11/25/2011    No current outpatient medications on file prior to visit.   No current facility-administered medications on file prior to visit.     No Known Allergies  Past Medical History:  Diagnosis Date  . ADD (attention deficit disorder)   . Allergy   . GERD (gastroesophageal reflux disease)    Social History   Social History Narrative   Lives with siblings (brother and sister)   Works - special needs - [employer]   Social History   Tobacco Use  . Smoking status: Former Smoker    Years: 3.00    Types: Cigarettes    Last attempt to quit: 02/11/2016    Years since quitting: 1.8  . Smokeless tobacco: Never Used  Substance Use Topics  . Alcohol use: Yes    Alcohol/week: 0.0 oz    Comment: beer 1-2.wk  . Drug use: No   family history includes Colon cancer in her maternal grandmother; Diabetes in her maternal grandfather.     Objective:  BP 106/70   Pulse 95   Temp 98.8 F (37.1 C) (Oral)   Resp 18   Ht 5' 5.5" (1.664 m)   Wt 127 lb 3.2 oz (57.7 kg)   LMP 11/27/2017   SpO2 98%   BMI 20.85 kg/m  Body mass index is 20.85 kg/m.  Physical Exam    Constitutional: She is oriented to person, place, and time. She appears well-developed and well-nourished. No distress.  HENT:  Head: Normocephalic and atraumatic.  Right Ear: Hearing and external ear normal.  Left Ear: Hearing and external ear normal.  Eyes: Conjunctivae are normal.  Neck: Normal range of motion.  Cardiovascular: Normal rate, regular rhythm and normal heart sounds.  No murmur heard. Pulmonary/Chest: Effort normal and breath sounds normal. She has no wheezes.  Neurological: She is alert and oriented to person, place, and time.  Skin: Skin is warm and dry.  Psychiatric: She has a normal mood and affect. Her behavior is normal. Judgment and thought content normal.  Vitals reviewed.   Assessment and Plan :  Attention deficit disorder, unspecified hyperactivity presence - Plan: amphetamine-dextroamphetamine (ADDERALL) 15 MG tablet  Attention deficit disorder (ADD) without hyperactivity - Plan: amphetamine-dextroamphetamine (ADDERALL) 15 MG tablet, amphetamine-dextroamphetamine (ADDERALL) 15 MG tablet   Refilled medications- doing well - no dose adjustment needed at this time.  Benny LennertSarah Weber PA-C  [facility] 12/25/2017 10:03 AM

## 2018-01-04 ENCOUNTER — Ambulatory Visit (INDEPENDENT_AMBULATORY_CARE_PROVIDER_SITE_OTHER): Payer: BC Managed Care – PPO | Admitting: Orthopedic Surgery

## 2018-03-30 ENCOUNTER — Other Ambulatory Visit: Payer: Self-pay | Admitting: Physician Assistant

## 2018-03-30 DIAGNOSIS — F988 Other specified behavioral and emotional disorders with onset usually occurring in childhood and adolescence: Secondary | ICD-10-CM

## 2018-03-30 NOTE — Telephone Encounter (Signed)
Patient is requesting a refill of the following medications: Requested Prescriptions   Pending Prescriptions Disp Refills  . amphetamine-dextroamphetamine (ADDERALL) 15 MG tablet 60 tablet 0    Sig: Take 1 tablet by mouth 2 (two) times daily.    Date of patient request: 03/30/2018 Last office visit: 12/25/2017 Date of last refill: 02/24/2018 Last refill amount: 60 Follow up time period per chart:

## 2018-03-30 NOTE — Telephone Encounter (Signed)
Copied from CRM [id number]. Topic: Quick Communication - See Telephone Encounter >> Mar 30, 2018  8:25 AM [name], [name], NT wrote: CRM for notification. See Telephone encounter for: 03/30/18. Patient called and states she needs a refill of her amphetamine-dextroamphetamine (ADDERALL) 15 MG tablet   [facility] [address]  Please call patient when this rx is sent to pharmacy cb# [phone]

## 2018-03-30 NOTE — Telephone Encounter (Signed)
Adderall refill Last Refill:02/24/18 #60 Last OV: 12/25/17 PCP: Lilia ArgueSarah Weber,PA [facility] [address]

## 2018-04-01 MED ORDER — AMPHETAMINE-DEXTROAMPHETAMINE 15 MG PO TABS: 15.0000 mg | ORAL_TABLET | Freq: Two times a day (BID) | ORAL | 0 refills | Status: DC

## 2018-04-19 ENCOUNTER — Ambulatory Visit (HOSPITAL_COMMUNITY)
Admission: EM | Admit: 2018-04-19 | Discharge: 2018-04-19 | Disposition: A | Discharge status: Home / Self Care | Payer: BC Managed Care – PPO | Attending: Family Medicine | Admitting: Family Medicine

## 2018-04-19 ENCOUNTER — Encounter (HOSPITAL_COMMUNITY): Payer: Self-pay | Admitting: Emergency Medicine

## 2018-04-19 ENCOUNTER — Ambulatory Visit (INDEPENDENT_AMBULATORY_CARE_PROVIDER_SITE_OTHER): Payer: BC Managed Care – PPO

## 2018-04-19 DIAGNOSIS — S161XXA Strain of muscle, fascia and tendon at neck level, initial encounter: Secondary | ICD-10-CM

## 2018-04-19 DIAGNOSIS — G44319 Acute post-traumatic headache, not intractable: Secondary | ICD-10-CM

## 2018-04-19 DIAGNOSIS — G44309 Post-traumatic headache, unspecified, not intractable: Secondary | ICD-10-CM

## 2018-04-19 DIAGNOSIS — R42 Dizziness and giddiness: Secondary | ICD-10-CM | POA: Diagnosis not present

## 2018-04-19 MED ORDER — CYCLOBENZAPRINE HCL 5 MG PO TABS: 5.0000 mg | ORAL_TABLET | Freq: Every day | ORAL | 0 refills | Status: DC

## 2018-04-19 MED ORDER — NAPROXEN 500 MG PO TABS: 500.0000 mg | ORAL_TABLET | Freq: Two times a day (BID) | ORAL | 0 refills | Status: DC

## 2018-04-19 MED ORDER — KETOROLAC TROMETHAMINE 60 MG/2ML IM SOLN
60.0000 mg | Freq: Once | INTRAMUSCULAR | Status: AC
  Administered 2018-04-19: 60 mg via INTRAMUSCULAR

## 2018-04-19 MED ORDER — KETOROLAC TROMETHAMINE 60 MG/2ML IM SOLN
INTRAMUSCULAR | Status: AC
  Filled 2018-04-19: qty 2
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
## 2018-04-19 NOTE — Discharge Instructions (Signed)
Ice to neck at night, heat and massage during day to promote range of motion and prevent spasms.  Flexeril at night will likely help pain. May cause drowsiness. Please do not take if driving or drinking alcohol.   Naproxen twice a day for pain, take with food. May still use tylenol as needed. If symptoms do not improve in the next 2-3 weeks to follow up with your PCP.  If develop worsening of pain, weakness, dizziness, vision changes, confusion, nausea or vomiting please go to the Er.

## 2018-04-30 ENCOUNTER — Encounter (INDEPENDENT_AMBULATORY_CARE_PROVIDER_SITE_OTHER): Payer: Self-pay | Admitting: Family Medicine

## 2018-04-30 ENCOUNTER — Telehealth: Payer: Self-pay | Admitting: Physician Assistant

## 2018-04-30 ENCOUNTER — Ambulatory Visit (INDEPENDENT_AMBULATORY_CARE_PROVIDER_SITE_OTHER): Payer: BC Managed Care – PPO | Admitting: Family Medicine

## 2018-04-30 DIAGNOSIS — F988 Other specified behavioral and emotional disorders with onset usually occurring in childhood and adolescence: Secondary | ICD-10-CM

## 2018-04-30 DIAGNOSIS — M79671 Pain in right foot: Secondary | ICD-10-CM | POA: Diagnosis not present

## 2018-04-30 MED ORDER — ETODOLAC 400 MG PO TABS: 400.0000 mg | ORAL_TABLET | Freq: Two times a day (BID) | ORAL | 3 refills | Status: DC | PRN

## 2018-04-30 MED ORDER — DICLOFENAC SODIUM 1 % TD GEL: 4.0000 g | Freq: Four times a day (QID) | TRANSDERMAL | 6 refills | Status: DC | PRN

## 2018-04-30 NOTE — Addendum Note (Signed)
Addended by: Lillia CarmelHILTS, Zavannah Deblois J on: 04/30/2018 03:31 PM   Modules accepted: Orders

## 2018-04-30 NOTE — Progress Notes (Signed)
Office Visit Note   Patient: Maureen FishermanBreonna R Ressel           Date of Birth: 01/30/1991           MRN: [id number] Visit Date: 04/30/2018 Requested by: Morrell RiddleWeber, Sarah L, PA-C [address] [place], [place] [address] PCP: Larkin InaWeber, Sarah L, PA-C  Subjective: Chief Complaint  Patient presents with  . Right Foot - Pain    Worsening pain in the arch of the foot.  S/p plantar fascia injection 11/02/17.    HPI: She is a 27 year old with right foot pain.  Long-standing problems with her feet since college.  She has been treated for letter fasciitis in the past with intermittent injections.  They have given her good relief until last winter when she got 1 in her right foot that did not help.  In fact, it seems to have gotten worse since then.  Her pain is a little bit different, it is more in the arch of her foot than in the heel.  Denies any numbness or tingling.  Pain is present with weightbearing, goes away at rest.  She does not take medication for her pain.  She has tried different shoes and arch supports with minimal improvement.               ROS: Otherwise noncontributory  Objective: Vital Signs: There were no vitals taken for this visit.  Physical Exam:  Right foot: She has pes planus with hyperpronation nonweightbearing.  There is tenderness in the midportion of her arch near the first TMT joint on the plantar surface.  The joint itself is not tender.  Positive Tinel's at the tarsal tunnel but it does not completely reproduce her pain.  No pain with supination of the foot against resistance or with flexion of the toes against resistance.  Imaging: None done today.  Assessment & Plan: 1.  Chronic right foot pain, with pes planus and hyperpronation -We will try a tibial tendon brace for support.  Voltaren gel as needed.  If symptoms do not improve, then we will probably order repeat MRI scan.  Her last scan was 2012.   Follow-Up Instructions: No follow-ups on file.     Procedures: None  today.   PMFS History: Patient Active Problem List   Diagnosis Date Noted  . Esophageal reflux 10/02/2015  . ADD (attention deficit disorder) 11/25/2011   Past Medical History:  Diagnosis Date  . ADD (attention deficit disorder)   . Allergy   . GERD (gastroesophageal reflux disease)     Family History  Problem Relation Age of Onset  . Diabetes Maternal Grandfather   . Colon cancer Maternal Grandmother        75-80 ?    History reviewed. No pertinent surgical history. Social History   Occupational History    Comment: [employer]  . Occupation: Programmer, applicationsC Assistant    Comment: [employer]  . Occupation: CuratorHabilitation Technician  Tobacco Use  . Smoking status: Former Smoker    Years: 3.00    Types: Cigarettes    Last attempt to quit: 02/11/2016    Years since quitting: 2.2  . Smokeless tobacco: Never Used  Substance and Sexual Activity  . Alcohol use: Yes    Alcohol/week: 0.0 standard drinks    Comment: beer 1-2.wk  . Drug use: No  . Sexual activity: Yes    Partners: Female    Birth control/protection: Abstinence

## 2018-04-30 NOTE — Telephone Encounter (Signed)
Please see note below. 

## 2018-04-30 NOTE — Addendum Note (Signed)
Addended by: Lillia CarmelHILTS, Vadim Centola J on: 04/30/2018 03:17 PM   Modules accepted: Orders

## 2018-04-30 NOTE — Telephone Encounter (Signed)
Copied from CRM [phone]#[id number]. Topic: Quick Communication - See Telephone Encounter >> Apr 30, 2018  3:58 PM [name], Maureen, NT wrote: CRM for notification. See Telephone encounter for: 04/30/18. Patient called and states that [facility] are out of her amphetamine-dextroamphetamine (ADDERALL) 15 MG tablet ans she wis wondering if the RX can be sent to  [id number] - [place], [place] - [address] [facility] [address] [phone] (Phone) [phone] (Fax)

## 2018-05-03 ENCOUNTER — Encounter: Payer: Self-pay | Admitting: Physician Assistant

## 2018-05-03 ENCOUNTER — Telehealth: Payer: Self-pay | Admitting: Physician Assistant

## 2018-05-03 DIAGNOSIS — F988 Other specified behavioral and emotional disorders with onset usually occurring in childhood and adolescence: Secondary | ICD-10-CM

## 2018-05-03 MED ORDER — AMPHETAMINE-DEXTROAMPHETAMINE 15 MG PO TABS: 15.0000 mg | ORAL_TABLET | Freq: Two times a day (BID) | ORAL | 0 refills | Status: DC

## 2018-05-03 NOTE — Telephone Encounter (Signed)
Copied from CRM #[id number]. Topic: Quick Communication - Rx Refill/Question >> May 03, 2018  2:13 PM Beryle Beamsawoud, [phone] CopaJessica L wrote: Medication: amphetamine-dextroamphetamine (ADDERALL) 15 MG tablet [[id number]][[id number]] pharmacy called and stated that they are on back order and this needs to be sent to another store.   Has the patient contacted their pharmacy? No  Preferred Pharmacy (with phone number or street name):[facility] [address], [place], [place] [address]XB#[phone]  Agent: Please be advised that RX refills may take up to 3 business days. We ask that you follow-up with your pharmacy.

## 2018-05-04 ENCOUNTER — Telehealth (INDEPENDENT_AMBULATORY_CARE_PROVIDER_SITE_OTHER): Payer: Self-pay

## 2018-05-04 NOTE — Telephone Encounter (Signed)
Advised patient to continue with the Etodolac.

## 2018-05-04 NOTE — Telephone Encounter (Signed)
Please see note below. 

## 2018-05-04 NOTE — Telephone Encounter (Signed)
Authorization for the Diclofenac gel was denied. Continue with the Etodolac instead?

## 2018-05-04 NOTE — Telephone Encounter (Signed)
Yes, we'll just try the etodolac.

## 2018-05-06 ENCOUNTER — Telehealth: Payer: Self-pay | Admitting: Physician Assistant

## 2018-05-06 NOTE — Telephone Encounter (Signed)
Called and spoke with pt. Rescheduled her appt with Benny LennertSarah Weber to 9/12 at 8:40 am - Advised of time, building number and late policy.

## 2018-05-10 MED ORDER — AMPHETAMINE-DEXTROAMPHETAMINE 15 MG PO TABS: 15.0000 mg | ORAL_TABLET | Freq: Two times a day (BID) | ORAL | 0 refills | Status: DC

## 2018-05-10 MED ORDER — AMPHETAMINE-DEXTROAMPHETAMINE 15 MG PO TABS
15.0000 mg | ORAL_TABLET | Freq: Two times a day (BID) | ORAL | 0 refills | Status: DC
Start: 2018-05-10 — End: 2019-12-05

## 2018-05-10 NOTE — Telephone Encounter (Signed)
Please call and cancel the ones that I originally [facility]

## 2018-05-27 ENCOUNTER — Ambulatory Visit: Payer: BC Managed Care – PPO | Admitting: Physician Assistant

## 2018-05-27 ENCOUNTER — Other Ambulatory Visit: Payer: Self-pay

## 2018-05-27 ENCOUNTER — Encounter: Payer: Self-pay | Admitting: Physician Assistant

## 2018-05-27 VITALS — BP 103/70 | HR 103 | Temp 98.7°F | Resp 18 | Ht 65.5 in | Wt 131.2 lb

## 2018-05-27 DIAGNOSIS — R12 Heartburn: Secondary | ICD-10-CM | POA: Diagnosis not present

## 2018-05-27 DIAGNOSIS — F988 Other specified behavioral and emotional disorders with onset usually occurring in childhood and adolescence: Secondary | ICD-10-CM | POA: Diagnosis not present

## 2018-05-27 MED ORDER — RANITIDINE HCL 150 MG PO TABS: 150.0000 mg | ORAL_TABLET | Freq: Two times a day (BID) | ORAL | 2 refills | Status: DC

## 2018-05-27 MED ORDER — AMPHETAMINE-DEXTROAMPHETAMINE 15 MG PO TABS: 15.0000 mg | ORAL_TABLET | Freq: Two times a day (BID) | ORAL | 0 refills | Status: DC

## 2018-05-27 NOTE — Progress Notes (Signed)
Maureen Cantrell  MRN: [id number] DOB: 08/09/1991  PCP: Morrell RiddleWeber, Sarah L, PA-C  Chief Complaint  Patient presents with  . ADD MEDS    follow up     Subjective:  Pt presents to clinic for medication refill.  She had f/u planned in October but she wants to see me before I left so she could be set up for the next couple of months.  She is doing good with her current medications.  Heartburn - daily - using baking soda and it was helping but now it is not helping as much - seems worse with certain foods but sometimes she gets it without knowing why.  History is obtained by patient.  Review of Systems  Constitutional: Negative for appetite change, chills, fever and unexpected weight change.  Cardiovascular: Negative for chest pain and palpitations.  Gastrointestinal: Positive for abdominal pain (epigastric at time). Negative for diarrhea and nausea.       Hearturn  Psychiatric/Behavioral: Negative for sleep disturbance.    Patient Active Problem List   Diagnosis Date Noted  . Esophageal reflux 10/02/2015  . ADD (attention deficit disorder) 11/25/2011    Current Outpatient Medications on File Prior to Visit  Medication Sig Dispense Refill  . [START ON 06/03/2018] amphetamine-dextroamphetamine (ADDERALL) 15 MG tablet Take 1 tablet by mouth 2 (two) times daily. 60 tablet 0  . amphetamine-dextroamphetamine (ADDERALL) 15 MG tablet Take 1 tablet by mouth 2 (two) times daily. 60 tablet 0  . cyclobenzaprine (FLEXERIL) 5 MG tablet Take 1 tablet (5 mg total) by mouth at bedtime. 15 tablet 0  . diclofenac sodium (VOLTAREN) 1 % GEL Apply 4 g topically 4 (four) times daily as needed. 500 g 6  . etodolac (LODINE) 400 MG tablet Take 1 tablet (400 mg total) by mouth 2 (two) times daily as needed. 60 tablet 3  . naproxen (NAPROSYN) 500 MG tablet Take 1 tablet (500 mg total) by mouth 2 (two) times daily. 30 tablet 0   No current facility-administered medications on file prior to visit.     No  Known Allergies  Past Medical History:  Diagnosis Date  . ADD (attention deficit disorder)   . Allergy   . GERD (gastroesophageal reflux disease)    Social History   Social History Narrative   Lives with siblings (brother and sister)   Works - special needs - [employer]   Social History   Tobacco Use  . Smoking status: Former Smoker    Years: 3.00    Types: Cigarettes    Last attempt to quit: 02/11/2016    Years since quitting: 2.2  . Smokeless tobacco: Never Used  Substance Use Topics  . Alcohol use: Yes    Alcohol/week: 0.0 standard drinks    Comment: beer 1-2.wk  . Drug use: No   family history includes Colon cancer in her maternal grandmother; Diabetes in her maternal grandfather.     Objective:  BP 103/70   Pulse (!) 103   Temp 98.7 F (37.1 C) (Oral)   Resp 18   Ht 5' 5.5" (1.664 m)   Wt 131 lb 3.2 oz (59.5 kg)   LMP 04/29/2018   SpO2 99%   BMI 21.50 kg/m  Body mass index is 21.5 kg/m.  Wt Readings from Last 3 Encounters:  05/27/18 131 lb 3.2 oz (59.5 kg)  04/19/18 125 lb (56.7 kg)  12/25/17 127 lb 3.2 oz (57.7 kg)    Physical Exam  Constitutional: She is oriented to  person, place, and time. She appears well-developed and well-nourished.  HENT:  Head: Normocephalic and atraumatic.  Right Ear: Hearing and external ear normal.  Left Ear: Hearing and external ear normal.  Eyes: Conjunctivae are normal.  Neck: Normal range of motion.  Cardiovascular: Normal rate, regular rhythm and normal heart sounds.  No murmur heard. Pulmonary/Chest: Effort normal and breath sounds normal. She has no wheezes.  Abdominal: There is no tenderness. There is no guarding.  Neurological: She is alert and oriented to person, place, and time.  Skin: Skin is warm and dry.  Psychiatric: Judgment normal.  Vitals reviewed.   Assessment and Plan :  Heartburn - Plan: ranitidine (ZANTAC) 150 MG tablet  Attention deficit disorder, unspecified hyperactivity  presence - Plan: amphetamine-dextroamphetamine (ADDERALL) 15 MG tablet  Pt will contact me at the end of September so I can send in another month of Adderall to the pharmacy for her - that will allow her to get 3 months from this visit but not have more than 3 at the pharmacy at a time.  Trial of zantac for 2 weeks and then as needed if less than 3x/week - ok to continue daily for a total of 3 months.  If she gets no relief she will need a PPI - we discussed things to change in her lifestyle for improved symptoms.  Can have 6 months of medication from this visit - will need additional 3 month supply in 3 months  Patient verbalized to me that they understand the following: diagnosis, what is being done for them, what to expect and what should be done at home.  Their questions have been answered.  See after visit summary for patient specific instructions.  Benny Lennert PA-C  Primary [facility] at [facility] 05/27/2018 9:28 AM  Please note: Portions of this report may have been transcribed using dragon voice recognition software. Every effort was made to ensure accuracy; however, inadvertent computerized transcription errors may be present.

## 2018-05-27 NOTE — Patient Instructions (Addendum)
  PC@P  -[facility] Dage[facility]allings  [facility] - [address], [place], [place] [address] Phone: [phone]    If you have lab work done today you will be contacted with your lab results within the next 2 weeks.  If you have not heard from us then please contact us. The fastest way to get your results is to register for My Chart.   IF you received an x-ray today, you will receive an invoice from [facility]. Please contact [facility] at [phone] with questions or concerns regarding your invoice.   IF you received labwork today, you will receive an invoice from [facility]. Please contact [facility] at [phone] with questions or concerns regarding your invoice.   Our billing staff will not be able to assist you with questions regarding bills from these companies.  You will be contacted with the lab results as soon as they are available. The fastest way to get your results is to activate your My Chart account. Instructions are located on the last page of this paperwork. If you have not heard from us regarding the results in 2 weeks, please contact this office.

## 2018-06-25 ENCOUNTER — Ambulatory Visit: Payer: BC Managed Care – PPO | Admitting: Physician Assistant
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
## 2019-11-19 ENCOUNTER — Ambulatory Visit: Payer: BC Managed Care – PPO | Attending: Internal Medicine

## 2019-12-05 ENCOUNTER — Encounter (HOSPITAL_COMMUNITY): Payer: Self-pay | Admitting: Emergency Medicine

## 2019-12-05 ENCOUNTER — Other Ambulatory Visit: Payer: Self-pay

## 2019-12-05 ENCOUNTER — Ambulatory Visit (HOSPITAL_COMMUNITY)
Admission: EM | Admit: 2019-12-05 | Discharge: 2019-12-05 | Disposition: A | Discharge status: Home / Self Care | Payer: BC Managed Care – PPO | Attending: Family Medicine | Admitting: Family Medicine

## 2019-12-05 DIAGNOSIS — Z3202 Encounter for pregnancy test, result negative: Secondary | ICD-10-CM | POA: Diagnosis not present

## 2019-12-05 DIAGNOSIS — R300 Dysuria: Secondary | ICD-10-CM

## 2019-12-05 LAB — POCT URINALYSIS DIP (DEVICE)
Bilirubin Urine: NEGATIVE
Glucose, UA: NEGATIVE mg/dL
Hgb urine dipstick: NEGATIVE
Ketones, ur: NEGATIVE mg/dL
Leukocytes,Ua: NEGATIVE
Nitrite: NEGATIVE
Protein, ur: NEGATIVE mg/dL
Specific Gravity, Urine: 1.01 (ref 1.005–1.030)
Urobilinogen, UA: 0.2 mg/dL (ref 0.0–1.0)
pH: 7 (ref 5.0–8.0)

## 2019-12-05 LAB — POCT PREGNANCY, URINE: Preg Test, Ur: NEGATIVE

## 2019-12-05 LAB — POC URINE PREG, ED: Preg Test, Ur: NEGATIVE

## 2019-12-05 MED ORDER — CEPHALEXIN 500 MG PO CAPS: 500.0000 mg | ORAL_CAPSULE | Freq: Two times a day (BID) | ORAL | 0 refills | Status: AC

## 2019-12-05 NOTE — ED Provider Notes (Signed)
[place]    CSN: [id number] Arrival date & time: 12/05/19  1853      History   Chief Complaint Chief Complaint  Patient presents with  . Urinary Tract Infection    HPI Maureen Cantrell is a 29 y.o. female.   HPI  Patient has dysuria, cramping in her suprapubic, strong odor to urine, lighter color to urine since Friday.  No fever chills.  No flank pain.  No nausea or vomiting.  No history of kidney stones or kidney infection.  Patient is not pregnant.  No suspicion of sexually transmitted disease or new sexual partner.  No vaginal discharge.  Past Medical History:  Diagnosis Date  . ADD (attention deficit disorder)   . Allergy   . GERD (gastroesophageal reflux disease)     Patient Active Problem List   Diagnosis Date Noted  . Esophageal reflux 10/02/2015  . ADD (attention deficit disorder) 11/25/2011    History reviewed. No pertinent surgical history.  OB History   No obstetric history on file.      Home Medications    Prior to Admission medications   Medication Sig Start Date End Date Taking? Authorizing Provider  cephALEXin (KEFLEX) 500 MG capsule Take 1 capsule (500 mg total) by mouth 2 (two) times daily. 12/05/19   Raylene Everts, MD  MYDAYIS 37.5 MG CP24 Take 1 capsule by mouth daily. 10/21/19   [provider]  ranitidine (ZANTAC) 150 MG tablet Take 1 tablet (150 mg total) by mouth 2 (two) times daily. 05/27/18 12/05/19  Mancel Bale, PA-C    Family History Family History  Problem Relation Age of Onset  . Diabetes Maternal Grandfather   . Colon cancer Maternal Grandmother        75-80 ?    Social History Social History   Tobacco Use  . Smoking status: Former Smoker    Years: 3.00    Types: Cigarettes    Quit date: 02/11/2016    Years since quitting: 3.8  . Smokeless tobacco: Never Used  Substance Use Topics  . Alcohol use: Yes    Alcohol/week: 0.0 standard drinks    Comment: beer 1-2.wk  . Drug use: Yes   Types: Marijuana     Allergies   Patient has no known allergies.   Review of Systems Review of Systems  Genitourinary: Positive for dysuria and pelvic pain. Negative for dyspareunia, hematuria and menstrual problem.     Physical Exam Triage Vital Signs ED Triage Vitals  Enc Vitals Group     BP 12/05/19 1942 115/74     Pulse Rate 12/05/19 1942 87     Resp 12/05/19 1942 16     Temp 12/05/19 1942 99.1 F (37.3 C)     Temp Source 12/05/19 1942 Oral     SpO2 12/05/19 1942 99 %     Weight --      Height --      Head Circumference --      Peak Flow --      Pain Score 12/05/19 1943 8     Pain Loc --      Pain Edu? --      Excl. in [place]? --    No data found.  Updated Vital Signs BP 115/74 (BP Location: Right Arm)   Pulse 87   Temp 99.1 F (37.3 C) (Oral)   Resp 16   LMP 11/28/2019   SpO2 99%      Physical Exam Constitutional:  General: She is not in acute distress.    Appearance: She is well-developed.  HENT:     Head: Normocephalic and atraumatic.  Eyes:     Conjunctiva/sclera: Conjunctivae normal.     Pupils: Pupils are equal, round, and reactive to light.  Cardiovascular:     Rate and Rhythm: Normal rate.  Pulmonary:     Effort: Pulmonary effort is normal. No respiratory distress.  Abdominal:     General: There is no distension.     Palpations: Abdomen is soft.     Tenderness: There is no right CVA tenderness or left CVA tenderness.  Musculoskeletal:        General: Normal range of motion.     Cervical back: Normal range of motion.  Skin:    General: Skin is warm and dry.  Neurological:     Mental Status: She is alert.  Psychiatric:        Mood and Affect: Mood normal.        Behavior: Behavior normal.      UC Treatments / Results  Labs (all labs ordered are listed, but only abnormal results are displayed) Labs Reviewed  URINE CULTURE  POCT URINALYSIS DIP (DEVICE)  POCT PREGNANCY, URINE  POC URINE PREG, ED    EKG   Radiology No  results found.  Procedures Procedures (including critical care time)  Medications Ordered in UC Medications - No data to display  Initial Impression / Assessment and Plan / UC Course  I have reviewed the triage vital signs and the nursing notes.  Pertinent labs & imaging results that were available during my care of the patient were reviewed by me and considered in my medical decision making (see chart for details).    Urinalysis is negative,  urine pregnancy test is negative we will treat with Keflex based on her symptoms Will send urine for culture follow-up as needed Final Clinical Impressions(s) / UC Diagnoses   Final diagnoses:  Dysuria     Discharge Instructions     Drink more water Take the antibiotic as directed Call for problems    ED Prescriptions    Medication Sig Dispense Auth. Provider   cephALEXin (KEFLEX) 500 MG capsule Take 1 capsule (500 mg total) by mouth 2 (two) times daily. 10 capsule Eustace Moore, MD     PDMP not reviewed this encounter.   Eustace Moore, MD 12/05/19 2013

## 2019-12-05 NOTE — ED Triage Notes (Signed)
Burning with urination, odor to urine, cramping lower abdomen-symptoms started on friday

## 2019-12-05 NOTE — Discharge Instructions (Signed)
Drink more water Take the antibiotic as directed Call for problems

## 2019-12-07 LAB — URINE CULTURE: Culture: NO GROWTH
